# Patient Record
Sex: Male | Born: 1965 | Race: White | Hispanic: No | Marital: Married | Smoking: Never smoker
Health system: Southern US, Community
[De-identification: ages and names within clinical notes are randomized; demographics above are authoritative.]

## PROBLEM LIST (undated history)

## (undated) DIAGNOSIS — I10 Essential (primary) hypertension: Secondary | ICD-10-CM

## (undated) DIAGNOSIS — E7800 Pure hypercholesterolemia, unspecified: Secondary | ICD-10-CM

## (undated) HISTORY — PX: HERNIA REPAIR: SHX51

## (undated) HISTORY — PX: FRACTURE SURGERY: SHX138

## (undated) HISTORY — PX: CARDIAC CATHETERIZATION: SHX172

---

## 2001-12-14 ENCOUNTER — Ambulatory Visit (HOSPITAL_COMMUNITY): Admission: RE | Admit: 2001-12-14 | Discharge: 2001-12-14 | Payer: Self-pay | Admitting: Family Medicine

## 2001-12-14 ENCOUNTER — Encounter: Payer: Self-pay | Admitting: Family Medicine

## 2003-06-19 ENCOUNTER — Inpatient Hospital Stay (HOSPITAL_COMMUNITY): Admission: AD | Admit: 2003-06-19 | Discharge: 2003-06-20 | Payer: Self-pay | Admitting: Family Medicine

## 2006-12-02 ENCOUNTER — Emergency Department (HOSPITAL_COMMUNITY): Admission: EM | Admit: 2006-12-02 | Discharge: 2006-12-02 | Payer: Self-pay | Admitting: Emergency Medicine

## 2012-02-08 ENCOUNTER — Encounter (HOSPITAL_COMMUNITY): Payer: Self-pay | Admitting: *Deleted

## 2012-02-08 ENCOUNTER — Emergency Department (HOSPITAL_COMMUNITY): Payer: Worker's Compensation

## 2012-02-08 ENCOUNTER — Emergency Department (HOSPITAL_COMMUNITY)
Admission: EM | Admit: 2012-02-08 | Discharge: 2012-02-08 | Disposition: A | Discharge status: Home / Self Care | Payer: Worker's Compensation | Attending: Emergency Medicine | Admitting: Emergency Medicine

## 2012-02-08 DIAGNOSIS — S8390XA Sprain of unspecified site of unspecified knee, initial encounter: Secondary | ICD-10-CM

## 2012-02-08 DIAGNOSIS — M25569 Pain in unspecified knee: Secondary | ICD-10-CM | POA: Insufficient documentation

## 2012-02-08 DIAGNOSIS — I10 Essential (primary) hypertension: Secondary | ICD-10-CM | POA: Insufficient documentation

## 2012-02-08 DIAGNOSIS — E119 Type 2 diabetes mellitus without complications: Secondary | ICD-10-CM | POA: Insufficient documentation

## 2012-02-08 HISTORY — DX: Essential (primary) hypertension: I10

## 2012-02-08 MED ORDER — IBUPROFEN 800 MG PO TABS: 800.0000 mg | ORAL_TABLET | Freq: Three times a day (TID) | ORAL | Status: AC

## 2012-02-08 MED ORDER — HYDROCODONE-ACETAMINOPHEN 5-325 MG PO TABS: ORAL_TABLET | ORAL | Status: AC

## 2012-02-08 NOTE — ED Notes (Signed)
Twisted lt knee this am.

## 2012-02-08 NOTE — ED Provider Notes (Signed)
History     CSN: [id number]  Arrival date & time 02/08/12  1152   First MD Initiated Contact with Patient 02/08/12 1222      Chief Complaint  Patient presents with  . Knee Pain    (Consider location/radiation/quality/duration/timing/severity/associated sxs/prior treatment) Patient is a 46 y.o. male presenting with knee pain. The history is provided by the patient.  Knee Pain This is a new problem. The current episode started today. The problem occurs constantly. The problem has been unchanged. Associated symptoms include arthralgias. Pertinent negatives include no chills, fever, joint swelling, neck pain, numbness, vomiting or weakness. The symptoms are aggravated by twisting and walking. He has tried nothing for the symptoms. The treatment provided no relief.    Past Medical History  Diagnosis Date  . Diabetes mellitus   . Hypertension     Past Surgical History  Procedure Date  . Fracture surgery   . Hernia repair   . Cardiac catheterization     History reviewed. No pertinent family history.  History  Substance Use Topics  . Smoking status: Never Smoker   . Smokeless tobacco: Not on file  . Alcohol Use: Yes      Review of Systems  Constitutional: Negative for fever and chills.  HENT: Negative for neck pain.   Gastrointestinal: Negative for vomiting.  Genitourinary: Negative for dysuria and difficulty urinating.  Musculoskeletal: Positive for arthralgias. Negative for back pain and joint swelling.  Skin: Negative for color change and wound.  Neurological: Negative for weakness and numbness.  All other systems reviewed and are negative.    Allergies  Review of patient's allergies indicates no known allergies.  Home Medications   Current Outpatient Rx  Name Route Sig Dispense Refill  . METFORMIN HCL 500 MG PO TABS Oral Take 500 mg by mouth 2 (two) times daily with a meal.    . PRESCRIPTION MEDICATION Oral Take 1 tablet by mouth daily. Blood pressure       BP 141/88  Pulse 72  Temp 98.3 F (36.8 C) (Oral)  Resp 20  Ht 6\' 2"  (1.88 m)  Wt 245 lb (111.131 kg)  BMI 31.46 kg/m2  SpO2 100%  Physical Exam  Nursing note and vitals reviewed. Constitutional: He is oriented to person, place, and time. He appears well-developed and well-nourished. No distress.  Cardiovascular: Normal rate, regular rhythm, normal heart sounds and intact distal pulses.   Pulmonary/Chest: Effort normal and breath sounds normal.  Musculoskeletal: He exhibits tenderness. He exhibits no edema.       Left knee: He exhibits normal range of motion, no swelling, no effusion, no ecchymosis, no deformity, no laceration, no erythema and no bony tenderness. tenderness found. Medial joint line and lateral joint line tenderness noted. No patellar tendon tenderness noted.       Legs:      ttp of the left knee.  No erythema, bruising or deformity.    Neurological: He is alert and oriented to person, place, and time. He exhibits normal muscle tone. Coordination normal.  Skin: Skin is warm and dry. No erythema.    ED Course  Procedures (including critical care time)  Labs Reviewed - No data to display Dg Knee Complete 4 Views Left  02/08/2012  *RADIOLOGY REPORT*  Clinical Data: Twisting injury and pain.  LEFT KNEE - COMPLETE 4+ VIEW  Comparison: None  Findings: Four views of the left knee were obtained.  Negative for acute fracture or dislocation.  No significant degenerative changes.  No evidence  for a large joint effusion.  IMPRESSION: No acute bony abnormalities.  Original Report Authenticated By: Richarda Overlie, M.D.        MDM    ttp of the anterior left knee w/o deformity, effusion or obvious instability.  Patient is ambulatory.  Likely sprain.  He agrees to f/u with ortho if sx's are not improving  Patient / Family / Caregiver understand and agree with initial ED impression and plan with expectations set for ED visit. Pt stable in ED with no significant deterioration  in condition. Pt feels improved after observation and/or treatment in ED.    Prescribed: norco #15 ibuprofen   Medard Decuir L. Cristel Rail, [place] 02/10/12 1442

## 2012-02-08 NOTE — Discharge Instructions (Signed)
Knee Pain The knee is the complex joint between your thigh and your lower leg. It is made up of bones, tendons, ligaments, and cartilage. The bones that make up the knee are:  The femur in the thigh.   The tibia and fibula in the lower leg.   The patella or kneecap riding in the groove on the lower femur.  CAUSES  Knee pain is a common complaint with many causes. A few of these causes are:  Injury, such as:   A ruptured ligament or tendon injury.   Torn cartilage.   Medical conditions, such as:   Gout   Arthritis   Infections   Overuse, over training or overdoing a physical activity.  Knee pain can be minor or severe. Knee pain can accompany debilitating injury. Minor knee problems often respond well to self-care measures or get well on their own. More serious injuries may need medical intervention or even surgery. SYMPTOMS The knee is complex. Symptoms of knee problems can vary widely. Some of the problems are:  Pain with movement and weight bearing.   Swelling and tenderness.   Buckling of the knee.   Inability to straighten or extend your knee.   Your knee locks and you cannot straighten it.   Warmth and redness with pain and fever.   Deformity or dislocation of the kneecap.  DIAGNOSIS  Determining what is wrong may be very straight forward such as when there is an injury. It can also be challenging because of the complexity of the knee. Tests to make a diagnosis may include:  Your caregiver taking a history and doing a physical exam.   Routine X-rays can be used to rule out other problems. X-rays will not reveal a cartilage tear. Some injuries of the knee can be diagnosed by:   Arthroscopy a surgical technique by which a small video camera is inserted through tiny incisions on the sides of the knee. This procedure is used to examine and repair internal knee joint problems. Tiny instruments can be used during arthroscopy to repair the torn knee cartilage  (meniscus).   Arthrography is a radiology technique. A contrast liquid is directly injected into the knee joint. Internal structures of the knee joint then become visible on X-ray film.   An MRI scan is a non x-ray radiology procedure in which magnetic fields and a computer produce two- or three-dimensional images of the inside of the knee. Cartilage tears are often visible using an MRI scanner. MRI scans have largely replaced arthrography in diagnosing cartilage tears of the knee.   Blood work.   Examination of the fluid that helps to lubricate the knee joint (synovial fluid). This is done by taking a sample out using a needle and a syringe.  TREATMENT The treatment of knee problems depends on the cause. Some of these treatments are:  Depending on the injury, proper casting, splinting, surgery or physical therapy care will be needed.   Give yourself adequate recovery time. Do not overuse your joints. If you begin to get sore during workout routines, back off. Slow down or do fewer repetitions.   For repetitive activities such as cycling or running, maintain your strength and nutrition.   Alternate muscle groups. For example if you are a weight lifter, work the upper body on one day and the lower body the next.   Either tight or weak muscles do not give the proper support for your knee. Tight or weak muscles do not absorb the stress placed   on the knee joint. Keep the muscles surrounding the knee strong.   Take care of mechanical problems.   If you have flat feet, orthotics or special shoes may help. See your caregiver if you need help.   Arch supports, sometimes with wedges on the inner or outer aspect of the heel, can help. These can shift pressure away from the side of the knee most bothered by osteoarthritis.   A brace called an "unloader" brace also may be used to help ease the pressure on the most arthritic side of the knee.   If your caregiver has prescribed crutches, braces,  wraps or ice, use as directed. The acronym for this is PRICE. This means protection, rest, ice, compression and elevation.   Nonsteroidal anti-inflammatory drugs (NSAID's), can help relieve pain. But if taken immediately after an injury, they may actually increase swelling. Take NSAID's with food in your stomach. Stop them if you develop stomach problems. Do not take these if you have a history of ulcers, stomach pain or bleeding from the bowel. Do not take without your caregiver's approval if you have problems with fluid retention, heart failure, or kidney problems.   For ongoing knee problems, physical therapy may be helpful.   Glucosamine and chondroitin are over-the-counter dietary supplements. Both may help relieve the pain of osteoarthritis in the knee. These medicines are different from the usual anti-inflammatory drugs. Glucosamine may decrease the rate of cartilage destruction.   Injections of a corticosteroid drug into your knee joint may help reduce the symptoms of an arthritis flare-up. They may provide pain relief that lasts a few months. You may have to wait a few months between injections. The injections do have a small increased risk of infection, water retention and elevated blood sugar levels.   Hyaluronic acid injected into damaged joints may ease pain and provide lubrication. These injections may work by reducing inflammation. A series of shots may give relief for as long as 6 months.   Topical painkillers. Applying certain ointments to your skin may help relieve the pain and stiffness of osteoarthritis. Ask your pharmacist for suggestions. Many over the-counter products are approved for temporary relief of arthritis pain.   In some countries, doctors often prescribe topical NSAID's for relief of chronic conditions such as arthritis and tendinitis. A review of treatment with NSAID creams found that they worked as well as oral medications but without the serious side effects.    PREVENTION  Maintain a healthy weight. Extra pounds put more strain on your joints.   Get strong, stay limber. Weak muscles are a common cause of knee injuries. Stretching is important. Include flexibility exercises in your workouts.   Be smart about exercise. If you have osteoarthritis, chronic knee pain or recurring injuries, you may need to change the way you exercise. This does not mean you have to stop being active. If your knees ache after jogging or playing basketball, consider switching to swimming, water aerobics or other low-impact activities, at least for a few days a week. Sometimes limiting high-impact activities will provide relief.   Make sure your shoes fit well. Choose footwear that is right for your sport.   Protect your knees. Use the proper gear for knee-sensitive activities. Use kneepads when playing volleyball or laying carpet. Buckle your seat belt every time you drive. Most shattered kneecaps occur in car accidents.   Rest when you are tired.  SEEK MEDICAL CARE IF:  You have knee pain that is continual and does not   seem to be getting better.  SEEK IMMEDIATE MEDICAL CARE IF:  Your knee joint feels hot to the touch and you have a high fever. MAKE SURE YOU:   Understand these instructions.   Will watch your condition.   Will get help right away if you are not doing well or get worse.  Document Released: 05/29/2007 Document Revised: 07/21/2011 Document Reviewed: 05/29/2007 [employer] Patient Information 2012 [employer]. 

## 2012-02-11 NOTE — ED Provider Notes (Signed)
Medical screening examination/treatment/procedure(s) were performed by non-physician practitioner and as supervising physician I was immediately available for consultation/collaboration.   Joya Gaskins, MD 02/11/12 [phone]

## 2014-02-13 ENCOUNTER — Inpatient Hospital Stay (HOSPITAL_COMMUNITY)
Admission: EM | Admit: 2014-02-13 | Discharge: 2014-02-18 | DRG: 603 | Disposition: A | Discharge status: Home Health | Payer: BC Managed Care – PPO | Attending: Internal Medicine | Admitting: Internal Medicine

## 2014-02-13 ENCOUNTER — Emergency Department (HOSPITAL_COMMUNITY): Payer: BC Managed Care – PPO

## 2014-02-13 ENCOUNTER — Encounter (HOSPITAL_COMMUNITY): Payer: Self-pay | Admitting: Emergency Medicine

## 2014-02-13 DIAGNOSIS — L03113 Cellulitis of right upper limb: Secondary | ICD-10-CM | POA: Diagnosis present

## 2014-02-13 DIAGNOSIS — L03119 Cellulitis of unspecified part of limb: Secondary | ICD-10-CM | POA: Diagnosis present

## 2014-02-13 DIAGNOSIS — M25462 Effusion, left knee: Secondary | ICD-10-CM

## 2014-02-13 DIAGNOSIS — M25569 Pain in unspecified knee: Secondary | ICD-10-CM | POA: Diagnosis present

## 2014-02-13 DIAGNOSIS — M7021 Olecranon bursitis, right elbow: Secondary | ICD-10-CM

## 2014-02-13 DIAGNOSIS — L02818 Cutaneous abscess of other sites: Secondary | ICD-10-CM

## 2014-02-13 DIAGNOSIS — M10062 Idiopathic gout, left knee: Secondary | ICD-10-CM

## 2014-02-13 DIAGNOSIS — E871 Hypo-osmolality and hyponatremia: Secondary | ICD-10-CM

## 2014-02-13 DIAGNOSIS — M545 Low back pain, unspecified: Secondary | ICD-10-CM | POA: Diagnosis present

## 2014-02-13 DIAGNOSIS — Z881 Allergy status to other antibiotic agents status: Secondary | ICD-10-CM

## 2014-02-13 DIAGNOSIS — IMO0002 Reserved for concepts with insufficient information to code with codable children: Principal | ICD-10-CM | POA: Diagnosis present

## 2014-02-13 DIAGNOSIS — L03818 Cellulitis of other sites: Secondary | ICD-10-CM

## 2014-02-13 DIAGNOSIS — M109 Gout, unspecified: Secondary | ICD-10-CM | POA: Diagnosis present

## 2014-02-13 DIAGNOSIS — I10 Essential (primary) hypertension: Secondary | ICD-10-CM | POA: Diagnosis present

## 2014-02-13 DIAGNOSIS — M25469 Effusion, unspecified knee: Secondary | ICD-10-CM | POA: Diagnosis present

## 2014-02-13 DIAGNOSIS — E119 Type 2 diabetes mellitus without complications: Secondary | ICD-10-CM | POA: Diagnosis present

## 2014-02-13 LAB — BASIC METABOLIC PANEL
Anion gap: 14 (ref 5–15)
BUN: 10 mg/dL (ref 6–23)
CO2: 25 mEq/L (ref 19–32)
Calcium: 9.1 mg/dL (ref 8.4–10.5)
Chloride: 99 mEq/L (ref 96–112)
Creatinine, Ser: 0.83 mg/dL (ref 0.50–1.35)
GFR calc Af Amer: >90 mL/min (ref >90)
GFR calc non Af Amer: >90 mL/min (ref >90)
Glucose, Bld: 170 mg/dL — ABNORMAL HIGH (ref 70–99)
Potassium: 3.8 mEq/L (ref 3.7–5.3)
Sodium: 138 mEq/L (ref 137–147)

## 2014-02-13 LAB — CBC WITH DIFFERENTIAL/PLATELET
Basophils Absolute: 0 10*3/uL (ref 0.0–0.1)
Basophils Relative: 0 % (ref 0–1)
Eosinophils Absolute: 0.2 10*3/uL (ref 0.0–0.7)
Eosinophils Relative: 2 % (ref 0–5)
HCT: 37.5 % — ABNORMAL LOW (ref 39.0–52.0)
Hemoglobin: 12.8 g/dL — ABNORMAL LOW (ref 13.0–17.0)
Lymphocytes Relative: 17 % (ref 12–46)
Lymphs Abs: 1.5 10*3/uL (ref 0.7–4.0)
MCH: 32.8 pg (ref 26.0–34.0)
MCHC: 34.1 g/dL (ref 30.0–36.0)
MCV: 96.2 fL (ref 78.0–100.0)
Monocytes Absolute: 0.9 10*3/uL (ref 0.1–1.0)
Monocytes Relative: 10 % (ref 3–12)
Neutro Abs: 6.5 10*3/uL (ref 1.7–7.7)
Neutrophils Relative %: 71 % (ref 43–77)
Platelets: 152 10*3/uL (ref 150–400)
RBC: 3.9 MIL/uL — ABNORMAL LOW (ref 4.22–5.81)
RDW: 12.3 % (ref 11.5–15.5)
WBC: 9.1 10*3/uL (ref 4.0–10.5)

## 2014-02-13 LAB — HEMOGLOBIN A1C
HEMOGLOBIN A1C: 6.8 % — AB (ref <5.7)
Mean Plasma Glucose: 148 mg/dL — ABNORMAL HIGH (ref <117)

## 2014-02-13 LAB — CBG MONITORING, ED: Glucose-Capillary: 159 mg/dL — ABNORMAL HIGH (ref 70–99)

## 2014-02-13 MED ORDER — PIPERACILLIN-TAZOBACTAM 3.375 G IVPB
3.3750 g | Freq: Three times a day (TID) | INTRAVENOUS | Status: DC
  Administered 2014-02-13 – 2014-02-14 (×3): 3.375 g via INTRAVENOUS
  Filled 2014-02-13 (×12): qty 50

## 2014-02-13 MED ORDER — METFORMIN HCL 500 MG PO TABS
500.0000 mg | ORAL_TABLET | Freq: Two times a day (BID) | ORAL | Status: DC
  Administered 2014-02-14: 500 mg via ORAL
  Filled 2014-02-13: qty 1

## 2014-02-13 MED ORDER — ACETAMINOPHEN 650 MG RE SUPP: 650.0000 mg | Freq: Four times a day (QID) | RECTAL | Status: DC | PRN

## 2014-02-13 MED ORDER — ONDANSETRON HCL 4 MG/2ML IJ SOLN: 4.0000 mg | Freq: Four times a day (QID) | INTRAMUSCULAR | Status: DC | PRN

## 2014-02-13 MED ORDER — ALUM & MAG HYDROXIDE-SIMETH 200-200-20 MG/5ML PO SUSP: 30.0000 mL | Freq: Four times a day (QID) | ORAL | Status: DC | PRN

## 2014-02-13 MED ORDER — PIPERACILLIN-TAZOBACTAM 3.375 G IVPB
INTRAVENOUS | Status: AC
  Filled 2014-02-13: qty 100

## 2014-02-13 MED ORDER — HEPARIN SODIUM (PORCINE) 5000 UNIT/ML IJ SOLN
5000.0000 [IU] | Freq: Three times a day (TID) | INTRAMUSCULAR | Status: DC
  Administered 2014-02-13 – 2014-02-18 (×14): 5000 [IU] via SUBCUTANEOUS
  Filled 2014-02-13 (×13): qty 1

## 2014-02-13 MED ORDER — SODIUM CHLORIDE 0.9 % IV SOLN
INTRAVENOUS | Status: DC
  Administered 2014-02-13 – 2014-02-16 (×4): via INTRAVENOUS

## 2014-02-13 MED ORDER — ZOLPIDEM TARTRATE 5 MG PO TABS
5.0000 mg | ORAL_TABLET | Freq: Every evening | ORAL | Status: DC | PRN
  Administered 2014-02-13 – 2014-02-14 (×3): 5 mg via ORAL
  Filled 2014-02-13 (×4): qty 1

## 2014-02-13 MED ORDER — ACETAMINOPHEN 325 MG PO TABS: 650.0000 mg | ORAL_TABLET | Freq: Four times a day (QID) | ORAL | Status: DC | PRN

## 2014-02-13 MED ORDER — ONDANSETRON HCL 4 MG PO TABS: 4.0000 mg | ORAL_TABLET | Freq: Four times a day (QID) | ORAL | Status: DC | PRN

## 2014-02-13 MED ORDER — OXYCODONE HCL 5 MG PO TABS
5.0000 mg | ORAL_TABLET | ORAL | Status: DC | PRN
  Administered 2014-02-13 – 2014-02-17 (×14): 5 mg via ORAL
  Filled 2014-02-13 (×14): qty 1

## 2014-02-13 MED ORDER — VANCOMYCIN HCL IN DEXTROSE 1-5 GM/200ML-% IV SOLN
1000.0000 mg | Freq: Once | INTRAVENOUS | Status: DC
Start: 2014-02-13 — End: 2014-02-13
  Administered 2014-02-13: 1000 mg via INTRAVENOUS
  Filled 2014-02-13: qty 200

## 2014-02-13 MED ORDER — DIPHENHYDRAMINE HCL 50 MG/ML IJ SOLN
25.0000 mg | Freq: Once | INTRAMUSCULAR | Status: AC
  Administered 2014-02-13: 25 mg via INTRAVENOUS
  Filled 2014-02-13: qty 1

## 2014-02-13 MED ORDER — DOXYCYCLINE HYCLATE 100 MG IV SOLR
100.0000 mg | Freq: Once | INTRAVENOUS | Status: AC
  Administered 2014-02-13: 100 mg via INTRAVENOUS
  Filled 2014-02-13: qty 100

## 2014-02-13 MED ORDER — SODIUM CHLORIDE 0.9 % IV SOLN
INTRAVENOUS | Status: DC
  Administered 2014-02-13: 1000 mL via INTRAVENOUS

## 2014-02-13 MED ORDER — LOSARTAN POTASSIUM 50 MG PO TABS
50.0000 mg | ORAL_TABLET | Freq: Every day | ORAL | Status: DC
  Administered 2014-02-14 – 2014-02-18 (×5): 50 mg via ORAL
  Filled 2014-02-13 (×5): qty 1

## 2014-02-13 MED ORDER — FAMOTIDINE IN NACL 20-0.9 MG/50ML-% IV SOLN
20.0000 mg | Freq: Once | INTRAVENOUS | Status: AC
  Administered 2014-02-13: 20 mg via INTRAVENOUS
  Filled 2014-02-13: qty 50

## 2014-02-13 NOTE — ED Provider Notes (Signed)
CSN: [id number]     Arrival date & time 02/13/14  1152 History  This chart was scribed for Allen Mulders, MD by Leone Payor, ED Scribe. This patient was seen in room APA15/[facility]15 and the patient's care was started 1:26 PM.    Chief Complaint  Patient presents with  . Elbow Pain   The history is provided by the patient. No language interpreter was used.    HPI Comments: Allen Kaiser is a 48 y.o. male who presents to the Emergency Department complaining of 3 days of gradual onset, constant, gradually worsening right elbow swelling and redness. He reports associated pain that is gradually improving. Patient states initially the pain was 9/10 but currently is minimal. He states the pain is worse with movement but is 0/10 with rest. He has taken ibuprofen 800 mg with moderate relief. He denies known injury or trauma to the affected area. He denies history of similar symptoms elsewhere on the body. Patient states he has been seen at [facility] 3 times this week and was started on Bactrim which he has been compliant with. He has a history of gout to the foot about 10 years ago. He denies fever, nausea, vomiting.   Past Medical History  Diagnosis Date  . Diabetes mellitus   . Hypertension    Past Surgical History  Procedure Laterality Date  . Hernia repair    . Fracture surgery    . Cardiac catheterization     History reviewed. No pertinent family history. History  Substance Use Topics  . Smoking status: Never Smoker   . Smokeless tobacco: Not on file  . Alcohol Use: Yes    Review of Systems  Constitutional: Negative for fever and chills.  HENT: Negative for rhinorrhea and sore throat.   Eyes: Negative for visual disturbance.  Respiratory: Positive for cough. Negative for shortness of breath.   Cardiovascular: Negative for chest pain and leg swelling.  Gastrointestinal: Negative for nausea, vomiting and abdominal pain.  Genitourinary: Negative for dysuria.  Musculoskeletal:  Positive for arthralgias and joint swelling. Negative for back pain and neck pain.  Skin: Positive for color change (right elbow). Negative for rash.  Neurological: Negative for headaches.  Hematological: Does not bruise/bleed easily.  Psychiatric/Behavioral: Negative for confusion.      Allergies  Review of patient's allergies indicates no known allergies.  Home Medications   Prior to Admission medications   Medication Sig Start Date End Date Taking? Authorizing Provider  losartan (COZAAR) 50 MG tablet Take 50 mg by mouth daily.   Yes Historical Provider, MD  metFORMIN (GLUCOPHAGE) 500 MG tablet Take 500 mg by mouth 2 (two) times daily with a meal.   Yes Historical Provider, MD  sulfamethoxazole-trimethoprim (BACTRIM DS) 800-160 MG per tablet Take 1 tablet by mouth 2 (two) times daily. Started on 02/12/14   Yes Historical Provider, MD   BP 115/82  Pulse 66  Temp(Src) 98.3 F (36.8 C) (Oral)  Resp 16  Ht 6\' 2"  (1.88 m)  Wt 249 lb (112.946 kg)  BMI 31.96 kg/m2  SpO2 100% Physical Exam  Nursing note and vitals reviewed. Constitutional: He is oriented to person, place, and time. He appears well-developed and well-nourished.  HENT:  Head: Normocephalic and atraumatic.  Eyes: Conjunctivae and EOM are normal.  Cardiovascular: Normal rate, regular rhythm and normal heart sounds.   Pulses:      Radial pulses are 2+ on the right side.  Pulmonary/Chest: Effort normal and breath sounds normal. No respiratory distress.  He has no wheezes. He has no rales.  Abdominal: Soft. Bowel sounds are normal. He exhibits no distension. There is no tenderness.  Musculoskeletal: Normal range of motion. He exhibits no edema.  Swelling over olecranon bursa, tight and warm. Redness up the arm and down forearm measuring 12 cm. Not particularly tender to palpation.   Neurological: He is alert and oriented to person, place, and time. No cranial nerve deficit. Coordination normal.  Skin: Skin is warm and dry.   Psychiatric: He has a normal mood and affect.    ED Course  Procedures (including critical care time)  DIAGNOSTIC STUDIES: Oxygen Saturation is 98% on RA, normal by my interpretation.    COORDINATION OF CARE: 1:30 PM Discussed treatment plan with pt at bedside and pt agreed to plan.  2:39 PM Patient developed hives at the IV site upon receiving vancomycin.    Labs Review Labs Reviewed  BASIC METABOLIC PANEL - Abnormal; Notable for the following:    Glucose, Bld 170 (*)    All other components within normal limits  CBC WITH DIFFERENTIAL - Abnormal; Notable for the following:    RBC 3.90 (*)    Hemoglobin 12.8 (*)    HCT 37.5 (*)    All other components within normal limits  CBG MONITORING, ED - Abnormal; Notable for the following:    Glucose-Capillary 159 (*)    All other components within normal limits   Results for orders placed during the hospital encounter of 02/13/14  BASIC METABOLIC PANEL      Result Value Ref Range   Sodium 138  137 - 147 mEq/L   Potassium 3.8  3.7 - 5.3 mEq/L   Chloride 99  96 - 112 mEq/L   CO2 25  19 - 32 mEq/L   Glucose, Bld 170 (*) 70 - 99 mg/dL   BUN 10  6 - 23 mg/dL   Creatinine, Ser 1.610.83  0.50 - 1.35 mg/dL   Calcium 9.1  8.4 - 09.610.5 mg/dL   GFR calc non Af Amer >90  >90 mL/min   GFR calc Af Amer >90  >90 mL/min   Anion gap 14  5 - 15  CBC WITH DIFFERENTIAL      Result Value Ref Range   WBC 9.1  4.0 - 10.5 K/uL   RBC 3.90 (*) 4.22 - 5.81 MIL/uL   Hemoglobin 12.8 (*) 13.0 - 17.0 g/dL   HCT 04.537.5 (*) 40.939.0 - 81.152.0 %   MCV 96.2  78.0 - 100.0 fL   MCH 32.8  26.0 - 34.0 pg   MCHC 34.1  30.0 - 36.0 g/dL   RDW 91.412.3  78.211.5 - 95.615.5 %   Platelets 152  150 - 400 K/uL   Neutrophils Relative % 71  43 - 77 %   Neutro Abs 6.5  1.7 - 7.7 K/uL   Lymphocytes Relative 17  12 - 46 %   Lymphs Abs 1.5  0.7 - 4.0 K/uL   Monocytes Relative 10  3 - 12 %   Monocytes Absolute 0.9  0.1 - 1.0 K/uL   Eosinophils Relative 2  0 - 5 %   Eosinophils Absolute 0.2   0.0 - 0.7 K/uL   Basophils Relative 0  0 - 1 %   Basophils Absolute 0.0  0.0 - 0.1 K/uL  CBG MONITORING, ED      Result Value Ref Range   Glucose-Capillary 159 (*) 70 - 99 mg/dL    Imaging Review Dg Elbow Complete Right  02/13/2014   CLINICAL DATA:  Elbow pain, erythema and swelling.  No acute injury.  EXAM: RIGHT ELBOW - COMPLETE 3+ VIEW  COMPARISON:  None.  FINDINGS: The mineralization and alignment are normal. There is no evidence of acute fracture or dislocation. The joint spaces are maintained. There is spurring of the olecranon and coronoid processes. There is no elbow joint effusion. Subcutaneous edema is noted surrounding the elbow, especially posteriorly. There appears to be some swelling over the olecranon process, suggesting possible olecranon bursitis.  IMPRESSION: 1. No acute osseous findings. 2. Soft tissue swelling around the elbow, especially posteriorly. Consider olecranon bursitis.   Electronically Signed   By: Roxy HorsemanBill  Veazey M.D.   On: 02/13/2014 16:25     EKG Interpretation None      MDM   Final diagnoses:  Cellulitis of other specified site  Olecranon bursitis, right    Patient would most likely olecranon bursitis has developed a cellulitis. No evidence of joint infection. She has good range of motion of the elbow. The started with vancomycin here patient had an allergic reaction at the site where the vancomycin was going in IV develop hives in that arm. Vancomycin stopped patient treated Pepcid and Benadryl the hives are resolved. Patient switched over to doxycycline. Patient will need admission and continuation on IV doxycycline. The cellulitis is progressively getting worse. Patient's been seen by his primary care doctor everyday this week started on Septra 2 days ago. No significant improvement actually things have gotten worse. Sent in here from their office. Patient has a history of diabetes her sugars are well controlled. Patient's not febrile no leukocytosis. Do not  feel that there is a significant joint infection or abscess. The elbow and is nontender to palpation.  I personally performed the services described in this documentation, which was scribed in my presence. The recorded information has been reviewed and is accurate.     Allen MuldersScott Luv Mish, MD 02/13/14 [phone]

## 2014-02-13 NOTE — H&P (Addendum)
[facility] History and Physical  Allen Kaiser Limbaugh WUJ:[id number] DOB: 1966-03-25 DOA: 02/13/2014  Referring physician: Vanetta MuldersScott Zackowski, MD PCP: No primary provider on file.   Chief Complaint: Cellulitis  HPI: Allen Kaiser Mundorf is a 48 y.o. male diabetic who states that he has had redness and swelling of his right arm since about Monday. He states that he was seen in the PCP office on wednsday and was given antibiotics. He notes that the redness was getting worse. [name] Patient states that he is a diabetic and takes metformin. He does not keep track of his blood glucose levels. In addition he states that he has not had any trauma to the elbow or arm and has not had any recent travel   Review of Systems:  Constitutional:  No weight loss, night sweats, Fevers, chills HEENT:  No headaches  Cardio-vascular:  No chest pain, Orthopnea, PND, swelling in lower extremities, anasarca, dizziness, palpitations  GI:  No heartburn, indigestion, abdominal pain, nausea, vomiting, diarrhea, change in bowel habits, loss of appetite  Resp:  No shortness of breath with exertion or at rest. No excess mucus, no productive cough, No non-productive cough, No coughing up of blood.No change in color of mucus.No wheezing.No chest wall deformity  Skin:  ++rash lesion swelling noted.  GU:  no dysuria, change in color of urine, no urgency or frequency. No flank pain.  Musculoskeletal:  ++joint pain ++swelling. No back pain.  Psych:  No change in mood or affect. No depression or anxiety. No memory loss.   Past Medical History  Diagnosis Date  . Diabetes mellitus   . Hypertension    Past Surgical History  Procedure Laterality Date  . Hernia repair    . Fracture surgery    . Cardiac catheterization     Social History:  reports that he has never  smoked. He does not have any smokeless tobacco history on file. He reports that he drinks alcohol. He reports that he does not use illicit drugs.  No Known Allergies  History reviewed. No pertinent family history.   Prior to Admission medications   Medication Sig Start Date End Date Taking? Authorizing Provider  losartan (COZAAR) 50 MG tablet Take 50 mg by mouth daily.   Yes Historical Provider, MD  metFORMIN (GLUCOPHAGE) 500 MG tablet Take 500 mg by mouth 2 (two) times daily with a meal.   Yes Historical Provider, MD  sulfamethoxazole-trimethoprim (BACTRIM DS) 800-160 MG per tablet Take 1 tablet by mouth 2 (two) times daily. Started on 02/12/14   Yes Historical Provider, MD   Physical Exam: Filed Vitals:   02/13/14 1542  BP: 115/82  Pulse: 66  Temp:   Resp: 16    BP 115/82  Pulse 66  Temp(Src) 98.3 F (36.8 C) (Oral)  Resp 16  Ht 6\' 2"  (1.88 m)  Wt 112.946 kg (249 lb)  BMI 31.96 kg/m2  SpO2 100%  General:  Appears calm and comfortable Eyes: PERRL, normal lids, irises & conjunctiva ENT: grossly normal hearing, lips & tongue Neck: no LAD, masses or thyromegaly Cardiovascular: RRR, no m/r/g. No LE edema. Respiratory: CTA bilaterally, no w/r/r. Normal respiratory effort. Abdomen: soft, ntnd Skin: olecrenon bursitis and also there is significant induration and erythema on the medial aspect of the right elbow arm and forearm Musculoskeletal: right elbow swollen with erythema  noted and induration Psychiatric: grossly normal mood and affect, speech fluent and appropriate Neurologic: grossly non-focal.          Labs on Admission:  Basic Metabolic Panel:  Recent Labs Lab 02/13/14 1225  NA 138  K 3.8  CL 99  CO2 25  GLUCOSE 170*  BUN 10  CREATININE 0.83  CALCIUM 9.1   Liver Function Tests: No results found for this basename: AST, ALT, ALKPHOS, BILITOT, PROT, ALBUMIN,  in the last 168 hours No results found for this basename: LIPASE, AMYLASE,  in the last 168  hours No results found for this basename: AMMONIA,  in the last 168 hours CBC:  Recent Labs Lab 02/13/14 1225  WBC 9.1  NEUTROABS 6.5  HGB 12.8*  HCT 37.5*  MCV 96.2  PLT 152   Cardiac Enzymes: No results found for this basename: CKTOTAL, CKMB, CKMBINDEX, TROPONINI,  in the last 168 hours  BNP (last 3 results) No results found for this basename: PROBNP,  in the last 8760 hours CBG:  Recent Labs Lab 02/13/14 1402  GLUCAP 159*    Radiological Exams on Admission: Dg Elbow Complete Right  02/13/2014   CLINICAL DATA:  Elbow pain, erythema and swelling.  No acute injury.  EXAM: RIGHT ELBOW - COMPLETE 3+ VIEW  COMPARISON:  None.  FINDINGS: The mineralization and alignment are normal. There is no evidence of acute fracture or dislocation. The joint spaces are maintained. There is spurring of the olecranon and coronoid processes. There is no elbow joint effusion. Subcutaneous edema is noted surrounding the elbow, especially posteriorly. There appears to be some swelling over the olecranon process, suggesting possible olecranon bursitis.  IMPRESSION: 1. No acute osseous findings. 2. Soft tissue swelling around the elbow, especially posteriorly. Consider olecranon bursitis.   Electronically Signed   By: Roxy HorsemanBill  Veazey M.D.   On: 02/13/2014 16:25     Assessment/Plan Active Problems:   Diabetes mellitus   Cellulitis of right arm   Hypertension   Cellulitis of arm   1. Cellulitis of right arm -will start on IV antibiotics as he has been getting worse -given vanc in the ED but had a rash -will start on zosyn for now -cultures drawn  2. Diabetes Mellitus -he states he does not normally check his sugars at home -will check an A1C -monitor CBG -continue metformin  3. Hypertension -will continue with home medications    Code Status: Full Code indicate code status--if unknown or must be presumed, indicate so) Family Communication: None (indicate person spoken with, if applicable,  with phone number if by telephone) Disposition Plan: Home (indicate anticipated LOS)  Time spent: 60min  [facility] LLCKHAN,SAADAT A [facility] Pager [phone]  **Disclaimer: This note may have been dictated with voice recognition software. Similar sounding words can inadvertently be transcribed and this note may contain transcription errors which may not have been corrected upon publication of note.**

## 2014-02-13 NOTE — ED Notes (Signed)
Pain,swelling , redness rt elbow. [facility]  Has been seen there earlier in week, Sent here for eval

## 2014-02-13 NOTE — Progress Notes (Signed)
ANTIBIOTIC CONSULT NOTE - INITIAL  Pharmacy Consult for Zosyn Indication: Celluliti  No Known Allergies  Patient Measurements: Height: 6\' 2"  (188 cm) Weight: 249 lb (112.946 kg) IBW/kg (Calculated) : 82.2 Adjusted Body Weight:   Vital Signs: Temp: 98.4 F (36.9 C) (07/02 1813) Temp src: Oral (07/02 1813) BP: 120/80 mmHg (07/02 1813) Pulse Rate: 71 (07/02 1813) Intake/Output from previous day:   Intake/Output from this shift:    Labs:  Recent Labs  02/13/14 1225  WBC 9.1  HGB 12.8*  PLT 152  CREATININE 0.83   Estimated Creatinine Clearance: 145.5 ml/min (by C-G formula based on Cr of 0.83). No results found for this basename: VANCOTROUGH, VANCOPEAK, VANCORANDOM, GENTTROUGH, GENTPEAK, GENTRANDOM, TOBRATROUGH, TOBRAPEAK, TOBRARND, AMIKACINPEAK, AMIKACINTROU, AMIKACIN,  in the last 72 hours   Microbiology: No results found for this or any previous visit (from the past 720 hour(s)).  Medical History: Past Medical History  Diagnosis Date  . Diabetes mellitus   . Hypertension     Medications:  Scheduled:  . heparin  5,000 Units Subcutaneous 3 times per day  . losartan  50 mg Oral Daily  . [START ON 02/14/2014] metFORMIN  500 mg Oral BID WC  . piperacillin-tazobactam (ZOSYN)  IV  3.375 g Intravenous Q8H   Assessment: Cellulitis right arm Vancomycin given in ED, stopped due to reaction   Goal of Therapy:  Eradicate infection  Plan:  Zosyn 3.375 GM IV every 8 hours, infuse each dose over 4 hours Monitor renal function Labs per protocol  Raquel JamesPittman, Sanaya Gwilliam Bennett 02/13/2014,6:42 PM

## 2014-02-14 LAB — CBC
HCT: 36.8 % — ABNORMAL LOW (ref 39.0–52.0)
Hemoglobin: 12.5 g/dL — ABNORMAL LOW (ref 13.0–17.0)
MCH: 33.2 pg (ref 26.0–34.0)
MCHC: 34 g/dL (ref 30.0–36.0)
MCV: 97.9 fL (ref 78.0–100.0)
PLATELETS: 153 10*3/uL (ref 150–400)
RBC: 3.76 MIL/uL — ABNORMAL LOW (ref 4.22–5.81)
RDW: 12.5 % (ref 11.5–15.5)
WBC: 7.8 10*3/uL (ref 4.0–10.5)

## 2014-02-14 LAB — COMPREHENSIVE METABOLIC PANEL
ALT: 27 U/L (ref 0–53)
AST: 26 U/L (ref 0–37)
Albumin: 2.9 g/dL — ABNORMAL LOW (ref 3.5–5.2)
Alkaline Phosphatase: 88 U/L (ref 39–117)
Anion gap: 14 (ref 5–15)
BUN: 11 mg/dL (ref 6–23)
CALCIUM: 8.9 mg/dL (ref 8.4–10.5)
CO2: 23 mEq/L (ref 19–32)
Chloride: 100 mEq/L (ref 96–112)
Creatinine, Ser: 0.89 mg/dL (ref 0.50–1.35)
GFR calc non Af Amer: >90 mL/min (ref >90)
GLUCOSE: 161 mg/dL — AB (ref 70–99)
Potassium: 4.2 mEq/L (ref 3.7–5.3)
SODIUM: 137 meq/L (ref 137–147)
Total Bilirubin: 0.5 mg/dL (ref 0.3–1.2)
Total Protein: 6.6 g/dL (ref 6.0–8.3)

## 2014-02-14 LAB — GLUCOSE, CAPILLARY
GLUCOSE-CAPILLARY: 179 mg/dL — AB (ref 70–99)
Glucose-Capillary: 136 mg/dL — ABNORMAL HIGH (ref 70–99)
Glucose-Capillary: 159 mg/dL — ABNORMAL HIGH (ref 70–99)

## 2014-02-14 LAB — TSH: TSH: 2.25 u[IU]/mL (ref 0.350–4.500)

## 2014-02-14 MED ORDER — INSULIN ASPART 100 UNIT/ML ~~LOC~~ SOLN
0.0000 [IU] | Freq: Every day | SUBCUTANEOUS | Status: DC
  Administered 2014-02-15: 2 [IU] via SUBCUTANEOUS
  Administered 2014-02-17: 5 [IU] via SUBCUTANEOUS

## 2014-02-14 MED ORDER — MORPHINE SULFATE 2 MG/ML IJ SOLN
2.0000 mg | INTRAMUSCULAR | Status: DC | PRN
  Administered 2014-02-14 (×2): 2 mg via INTRAVENOUS
  Filled 2014-02-14 (×2): qty 1

## 2014-02-14 MED ORDER — INSULIN ASPART 100 UNIT/ML ~~LOC~~ SOLN
0.0000 [IU] | Freq: Three times a day (TID) | SUBCUTANEOUS | Status: DC
  Administered 2014-02-14 – 2014-02-16 (×5): 3 [IU] via SUBCUTANEOUS
  Administered 2014-02-16: 8 [IU] via SUBCUTANEOUS
  Administered 2014-02-16: 3 [IU] via SUBCUTANEOUS
  Administered 2014-02-17 (×2): 8 [IU] via SUBCUTANEOUS
  Administered 2014-02-17: 5 [IU] via SUBCUTANEOUS
  Administered 2014-02-18: 3 [IU] via SUBCUTANEOUS

## 2014-02-14 MED ORDER — CLINDAMYCIN PHOSPHATE 600 MG/50ML IV SOLN
600.0000 mg | Freq: Three times a day (TID) | INTRAVENOUS | Status: DC
  Administered 2014-02-14 – 2014-02-16 (×6): 600 mg via INTRAVENOUS
  Filled 2014-02-14 (×13): qty 50

## 2014-02-14 MED ORDER — MORPHINE SULFATE 2 MG/ML IJ SOLN
2.0000 mg | INTRAMUSCULAR | Status: DC | PRN
  Administered 2014-02-14 – 2014-02-16 (×9): 2 mg via INTRAVENOUS
  Filled 2014-02-14 (×9): qty 1

## 2014-02-14 NOTE — Progress Notes (Signed)
[facility] PROGRESS NOTE  Allen Kaiser ZOX:[id number] DOB: 09-Jan-1966 DOA: 02/13/2014 PCP: No primary provider on file.  Assessment/Plan: 1. Right elbow cellulitis. Concern for underlying joint infection. Patient was placed on Zosyn. This has been switched to clindamycin for MRSA coverage. We'll continue with current treatments. Keep right arm elevated. Will class orthopedic evaluation to see if joint needs aspiration. He does not have any leukocytosis and has not had any fevers since being in the hospital. He does have a history of gout, but reports last attack was approximately 10 years ago. 2. Diabetes. Continue sliding scale insulin. Metformin on hold. 3. Hypertension. Continue outpatient regimen  Code Status: full code Family Communication: discussed with patient and wife over the phone Disposition Plan: discharge home once improved   Consultants:  Orthopedic pending  Procedures:    Antibiotics:  vancomyin 7/2, discontinued due to allergic reaction  Zosyn 7/2>>7/3  Clindamycin 7/3>>  HPI/Subjective: Continued pain in right elbow, decreased range of motion  Objective: Filed Vitals:   02/14/14 1412  BP: 122/78  Pulse: 79  Temp: 98.7 F (37.1 C)  Resp: 20    Intake/Output Summary (Last 24 hours) at 02/14/14 1951 Last data filed at 02/14/14 1800  Gross per 24 hour  Intake   2204 ml  Output    525 ml  Net   1679 ml   Filed Weights   02/13/14 1159  Weight: 112.946 kg (249 lb)    Exam:   General:  NAD  Cardiovascular: S1, S2 RRR  Respiratory: CTA B  Abdomen: soft, nt, nd, bs+  Musculoskeletal: right elbow is edematous with effusion, tender to palpation, decreased range of motion   Data Reviewed: Basic Metabolic Panel:  Recent Labs Lab 02/13/14 1225 02/14/14 0546  NA 138 137  K 3.8 4.2  CL 99 100  CO2 25 23  GLUCOSE 170* 161*  BUN 10 11  CREATININE 0.83 0.89  CALCIUM 9.1 8.9   Liver Function Tests:  Recent Labs Lab  02/14/14 0546  AST 26  ALT 27  ALKPHOS 88  BILITOT 0.5  PROT 6.6  ALBUMIN 2.9*   No results found for this basename: LIPASE, AMYLASE,  in the last 168 hours No results found for this basename: AMMONIA,  in the last 168 hours CBC:  Recent Labs Lab 02/13/14 1225 02/14/14 0546  WBC 9.1 7.8  NEUTROABS 6.5  --   HGB 12.8* 12.5*  HCT 37.5* 36.8*  MCV 96.2 97.9  PLT 152 153   Cardiac Enzymes: No results found for this basename: CKTOTAL, CKMB, CKMBINDEX, TROPONINI,  in the last 168 hours BNP (last 3 results) No results found for this basename: PROBNP,  in the last 8760 hours CBG:  Recent Labs Lab 02/13/14 1402 02/14/14 0733 02/14/14 1623  GLUCAP 159* 136* 179*    No results found for this or any previous visit (from the past 240 hour(s)).   Studies: Dg Elbow Complete Right  02/13/2014   CLINICAL DATA:  Elbow pain, erythema and swelling.  No acute injury.  EXAM: RIGHT ELBOW - COMPLETE 3+ VIEW  COMPARISON:  None.  FINDINGS: The mineralization and alignment are normal. There is no evidence of acute fracture or dislocation. The joint spaces are maintained. There is spurring of the olecranon and coronoid processes. There is no elbow joint effusion. Subcutaneous edema is noted surrounding the elbow, especially posteriorly. There appears to be some swelling over the olecranon process, suggesting possible olecranon bursitis.  IMPRESSION: 1. No acute osseous findings. 2. Soft  tissue swelling around the elbow, especially posteriorly. Consider olecranon bursitis.   Electronically Signed   By: Roxy HorsemanBill  Veazey M.D.   On: 02/13/2014 16:25    Scheduled Meds: . clindamycin (CLEOCIN) IV  600 mg Intravenous 3 times per day  . heparin  5,000 Units Subcutaneous 3 times per day  . insulin aspart  0-15 Units Subcutaneous TID WC  . insulin aspart  0-5 Units Subcutaneous QHS  . losartan  50 mg Oral Daily   Continuous Infusions: . sodium chloride 50 mL/hr at 02/13/14 1824    Active Problems:    Diabetes mellitus   Cellulitis of right arm   Hypertension   Cellulitis of arm    Time spent: 35mins    Reginia Battie  [facility] Pager [phone]. If 7PM-7AM, please contact night-coverage at [email or web address], password [email or web address] 02/14/2014, 7:51 PM  LOS: 1 day

## 2014-02-14 NOTE — Progress Notes (Signed)
02/14/14 1206 Patient c/o persistent pain to right elbow, also stated "now my knee is starting to act up too". States pain unchanged with oxycodone 5 mg po as ordered PRN for pain this morning. Notified Dr. Kerry HoughMemon. Stated will see patient and address. Notified patient, stated okay. Earnstine RegalAshley Jacquie Lukes, RN

## 2014-02-14 NOTE — Progress Notes (Signed)
02/14/14 1427 Morphine 2 mg IV given as ordered PRN pain this afternoon. On reassessment, patient states "i don't feel anything right now unless I bump my arm on something". States comfortable, pain decreased with morphine. Stated "sometimes pain medication just doesn't work for me". Nursing to monitor. Earnstine RegalAshley Johana Hopkinson, RN

## 2014-02-14 NOTE — Progress Notes (Signed)
02/14/14 1807 Patient and wife expressed concerns regarding right elbow, wife stated "it looks worse now than it did last night". Discussed current IV antibiotics and keeping right arm elevated on pillow to help reduce swelling as MD discussed this afternoon. Pt stated understood. Pt aware of orthopedic consult in place. Dr Romeo AppleHarrison notified of consult per nursing secretary. Wife requested update from MD if possible. Notified Dr. Kerry HoughMemon. Stated would call and speak with them for update. Earnstine RegalAshley Sherise Geerdes, RN

## 2014-02-14 NOTE — Care Management Utilization Note (Signed)
UR completed 

## 2014-02-15 DIAGNOSIS — IMO0002 Reserved for concepts with insufficient information to code with codable children: Principal | ICD-10-CM

## 2014-02-15 DIAGNOSIS — E871 Hypo-osmolality and hyponatremia: Secondary | ICD-10-CM

## 2014-02-15 LAB — BASIC METABOLIC PANEL
Anion gap: 16 — ABNORMAL HIGH (ref 5–15)
BUN: 9 mg/dL (ref 6–23)
CO2: 23 mEq/L (ref 19–32)
Calcium: 9.2 mg/dL (ref 8.4–10.5)
Chloride: 90 mEq/L — ABNORMAL LOW (ref 96–112)
Creatinine, Ser: 0.74 mg/dL (ref 0.50–1.35)
GFR calc Af Amer: >90 mL/min (ref >90)
GLUCOSE: 189 mg/dL — AB (ref 70–99)
Potassium: 4.5 mEq/L (ref 3.7–5.3)
SODIUM: 129 meq/L — AB (ref 137–147)

## 2014-02-15 LAB — URINALYSIS, ROUTINE W REFLEX MICROSCOPIC
BILIRUBIN URINE: NEGATIVE
Glucose, UA: 500 mg/dL — AB
Hgb urine dipstick: NEGATIVE
Ketones, ur: 15 mg/dL — AB
LEUKOCYTES UA: NEGATIVE
Nitrite: NEGATIVE
Protein, ur: NEGATIVE mg/dL
SPECIFIC GRAVITY, URINE: 1.01 (ref 1.005–1.030)
UROBILINOGEN UA: 4 mg/dL — AB (ref 0.0–1.0)
pH: 6 (ref 5.0–8.0)

## 2014-02-15 LAB — GLUCOSE, CAPILLARY: Glucose-Capillary: 208 mg/dL — ABNORMAL HIGH (ref 70–99)

## 2014-02-15 LAB — CBC
HCT: 38.7 % — ABNORMAL LOW (ref 39.0–52.0)
Hemoglobin: 13.1 g/dL (ref 13.0–17.0)
MCH: 32.9 pg (ref 26.0–34.0)
MCHC: 33.9 g/dL (ref 30.0–36.0)
MCV: 97.2 fL (ref 78.0–100.0)
Platelets: 174 10*3/uL (ref 150–400)
RBC: 3.98 MIL/uL — ABNORMAL LOW (ref 4.22–5.81)
RDW: 12.3 % (ref 11.5–15.5)
WBC: 9 10*3/uL (ref 4.0–10.5)

## 2014-02-15 LAB — URIC ACID: Uric Acid, Serum: 4.2 mg/dL (ref 4.0–7.8)

## 2014-02-15 MED ORDER — MAGNESIUM CITRATE PO SOLN
0.5000 | Freq: Once | ORAL | Status: AC
  Administered 2014-02-15: 0.5 via ORAL
  Filled 2014-02-15: qty 296

## 2014-02-15 MED ORDER — BISACODYL 5 MG PO TBEC
10.0000 mg | DELAYED_RELEASE_TABLET | Freq: Once | ORAL | Status: AC
  Administered 2014-02-15: 10 mg via ORAL
  Filled 2014-02-15: qty 2

## 2014-02-15 MED ORDER — INDOMETHACIN 25 MG PO CAPS
75.0000 mg | ORAL_CAPSULE | Freq: Two times a day (BID) | ORAL | Status: DC
  Administered 2014-02-15 – 2014-02-17 (×6): 75 mg via ORAL
  Filled 2014-02-15 (×6): qty 3

## 2014-02-15 MED ORDER — POLYETHYLENE GLYCOL 3350 17 G PO PACK
17.0000 g | PACK | Freq: Every day | ORAL | Status: DC
  Administered 2014-02-15: 17 g via ORAL
  Filled 2014-02-15 (×3): qty 1

## 2014-02-15 MED ORDER — LORAZEPAM 1 MG PO TABS
1.0000 mg | ORAL_TABLET | Freq: Once | ORAL | Status: AC
  Administered 2014-02-15: 1 mg via ORAL
  Filled 2014-02-15: qty 1

## 2014-02-15 MED ORDER — INDOMETHACIN ER 75 MG PO CPCR: 75.0000 mg | ORAL_CAPSULE | Freq: Two times a day (BID) | ORAL | Status: DC

## 2014-02-15 NOTE — Progress Notes (Signed)
02/16/12 1342 Late entry for 1150. Patient and family anxious regarding ortho consult this morning. Called Dr. Romeo AppleHarrison to make sure he would see patient today. Stated would see patient tomorrow. Consult placed and called yesterday by nursing secretary. Dr. Romeo AppleHarrison added to treatment team yesterday with consult order. Notified Dr. Kerry HoughMemon. Earnstine RegalAshley Richrd Kuzniar, RN

## 2014-02-15 NOTE — Progress Notes (Signed)
02/15/14 1119 Patient c/o left knee pain on assessment this morning. Stated "it's my gout." states has taken allopurinol for gout in the past, "every day when I took it like I was supposed to". Some edema noted to left knee, pt unable to bear weight as well to left leg d/t pain. Notified Dr. Kerry HoughMemon of c/o gout. Orders placed to start indomethacin. Given as ordered, discussed with patient. Instructed to call for assistance and not get up on his own for safety. Stated would call. Pt later requested "an emergency laxative, that will work quick". On reassessment, stated "my back is hurting and I think it's because I need a laxative. Last BM reported as 02/12/14. Bowel sounds present x 4 quadrants. No c/o abdominal pain. Notified MD. Orders placed for dulcolax and miralax. Given as ordered. Earnstine RegalAshley Cristina Ceniceros, RN

## 2014-02-15 NOTE — Progress Notes (Signed)
02/15/14 1741 Patient reported bowel movement this evening, stated "I could still go, still want something to take". Notified Dr. Kerry HoughMemon. Order received for magnesium citrate 1/2 bottle, may repeat with other 1/2 bottle if no results within an hour. Earnstine RegalAshley Patrick Salemi, RN

## 2014-02-15 NOTE — Progress Notes (Signed)
[facility] PROGRESS NOTE  Levie HeritageClint L Vanderschaaf ZOX:[id number] DOB: 11-Dec-1965 DOA: 02/13/2014 PCP: No primary provider on file.  Assessment/Plan: 1. Right elbow cellulitis. Concern for underlying joint infection. Currently on Clindamycin and clinically appears to be improving. Keep right arm elevated. I have requested orthopedic consultation to see if joint aspiration is required. Informed by staff that Dr. Romeo AppleHarrison will be in tomorrow morning to see patient. Updated patient who is agreeable. He does not have any leukocytosis and has not had any fevers since being in the hospital. He does have a history of gout, but reports last attack was approximately 10 years ago. 2. Left knee pain.  Left knee appears to be mildly swollen and feels warm.  No erythema. Decreased range of motion. Uric acid was checked and found to be normal.  Although this does not completely rule out gout. Patient started on indomethacin.  Will continue to follow. Await ortho input. 3. Diabetes. Continue sliding scale insulin. Metformin on hold. 4. Hypertension. Continue outpatient regimen 5. Low back pain. Possibly musculoskeletal. Patient reports pain in right flank that is worse with movement.  Feels that it may be related to constipation. Check urinalysis 6. Hyponatremia. Unclear cause, possible lab anomaly.  Recheck in am.   Code Status: full code Family Communication: discussed with patient and family at the bedside Disposition Plan: discharge home once improved   Consultants:  Orthopedic pending  Procedures:    Antibiotics:  vancomyin 7/2, discontinued due to allergic reaction  Zosyn 7/2>>7/3  Clindamycin 7/3>>  HPI/Subjective: Continued pain in right elbow, decreased range of motion  Objective: Filed Vitals:   02/15/14 0631  BP: 129/83  Pulse: 77  Temp: 98.5 F (36.9 C)  Resp: 20    Intake/Output Summary (Last 24 hours) at 02/15/14 1240 Last data filed at 02/15/14 0900  Gross per 24 hour   Intake   2260 ml  Output   2200 ml  Net     60 ml   Filed Weights   02/13/14 1159  Weight: 112.946 kg (249 lb)    Exam:   General:  NAD  Cardiovascular: S1, S2 RRR  Respiratory: CTA B  Abdomen: soft, nt, nd, bs+  Musculoskeletal: right elbow edema/effusion and erythema persist.  Improving range of motion. No discharge noted.  Data Reviewed: Basic Metabolic Panel:  Recent Labs Lab 02/13/14 1225 02/14/14 0546 02/15/14 0539  NA 138 137 129*  K 3.8 4.2 4.5  CL 99 100 90*  CO2 25 23 23   GLUCOSE 170* 161* 189*  BUN 10 11 9   CREATININE 0.83 0.89 0.74  CALCIUM 9.1 8.9 9.2   Liver Function Tests:  Recent Labs Lab 02/14/14 0546  AST 26  ALT 27  ALKPHOS 88  BILITOT 0.5  PROT 6.6  ALBUMIN 2.9*   No results found for this basename: LIPASE, AMYLASE,  in the last 168 hours No results found for this basename: AMMONIA,  in the last 168 hours CBC:  Recent Labs Lab 02/13/14 1225 02/14/14 0546 02/15/14 0539  WBC 9.1 7.8 9.0  NEUTROABS 6.5  --   --   HGB 12.8* 12.5* 13.1  HCT 37.5* 36.8* 38.7*  MCV 96.2 97.9 97.2  PLT 152 153 174   Cardiac Enzymes: No results found for this basename: CKTOTAL, CKMB, CKMBINDEX, TROPONINI,  in the last 168 hours BNP (last 3 results) No results found for this basename: PROBNP,  in the last 8760 hours CBG:  Recent Labs Lab 02/13/14 1402 02/14/14 0733 02/14/14 1623 02/14/14 2232  GLUCAP 159* 136* 179* 159*    No results found for this or any previous visit (from the past 240 hour(s)).   Studies: Dg Elbow Complete Right  02/13/2014   CLINICAL DATA:  Elbow pain, erythema and swelling.  No acute injury.  EXAM: RIGHT ELBOW - COMPLETE 3+ VIEW  COMPARISON:  None.  FINDINGS: The mineralization and alignment are normal. There is no evidence of acute fracture or dislocation. The joint spaces are maintained. There is spurring of the olecranon and coronoid processes. There is no elbow joint effusion. Subcutaneous edema is noted  surrounding the elbow, especially posteriorly. There appears to be some swelling over the olecranon process, suggesting possible olecranon bursitis.  IMPRESSION: 1. No acute osseous findings. 2. Soft tissue swelling around the elbow, especially posteriorly. Consider olecranon bursitis.   Electronically Signed   By: Roxy HorsemanBill  Veazey M.D.   On: 02/13/2014 16:25    Scheduled Meds: . clindamycin (CLEOCIN) IV  600 mg Intravenous 3 times per day  . heparin  5,000 Units Subcutaneous 3 times per day  . indomethacin  75 mg Oral BID WC  . insulin aspart  0-15 Units Subcutaneous TID WC  . insulin aspart  0-5 Units Subcutaneous QHS  . losartan  50 mg Oral Daily  . polyethylene glycol  17 g Oral Daily   Continuous Infusions: . sodium chloride 50 mL/hr at 02/14/14 2038    Active Problems:   Diabetes mellitus   Cellulitis of right arm   Hypertension   Cellulitis of arm    Time spent: 35mins    Dwana Garin  [facility] Pager [phone]. If 7PM-7AM, please contact night-coverage at [email or web address], password [email or web address] 02/15/2014, 12:40 PM  LOS: 2 days

## 2014-02-15 NOTE — Progress Notes (Signed)
02/15/14 1700 Patient reports no bowel movement today after dulcolax and miralax as ordered this am and prune juice his family brought him. States less back discomfort, but requested "something else to help me go". Text-paged Dr. Kerry HoughMemon to notify. Earnstine RegalAshley Mickle Campton, RN

## 2014-02-16 ENCOUNTER — Encounter (HOSPITAL_COMMUNITY): Payer: Self-pay

## 2014-02-16 DIAGNOSIS — M7020 Olecranon bursitis, unspecified elbow: Secondary | ICD-10-CM

## 2014-02-16 DIAGNOSIS — M25469 Effusion, unspecified knee: Secondary | ICD-10-CM

## 2014-02-16 LAB — BASIC METABOLIC PANEL
ANION GAP: 14 (ref 5–15)
BUN: 13 mg/dL (ref 6–23)
CO2: 27 mEq/L (ref 19–32)
Calcium: 9.4 mg/dL (ref 8.4–10.5)
Chloride: 97 mEq/L (ref 96–112)
Creatinine, Ser: 0.78 mg/dL (ref 0.50–1.35)
GFR calc non Af Amer: >90 mL/min (ref >90)
Glucose, Bld: 162 mg/dL — ABNORMAL HIGH (ref 70–99)
POTASSIUM: 4.2 meq/L (ref 3.7–5.3)
Sodium: 138 mEq/L (ref 137–147)

## 2014-02-16 LAB — GLUCOSE, CAPILLARY
GLUCOSE-CAPILLARY: 157 mg/dL — AB (ref 70–99)
GLUCOSE-CAPILLARY: 197 mg/dL — AB (ref 70–99)
Glucose-Capillary: 185 mg/dL — ABNORMAL HIGH (ref 70–99)
Glucose-Capillary: 190 mg/dL — ABNORMAL HIGH (ref 70–99)
Glucose-Capillary: 165 mg/dL — ABNORMAL HIGH (ref 70–99)
Glucose-Capillary: 273 mg/dL — ABNORMAL HIGH (ref 70–99)

## 2014-02-16 MED ORDER — SODIUM CHLORIDE 0.9 % IJ SOLN
10.0000 mL | Freq: Two times a day (BID) | INTRAMUSCULAR | Status: DC
  Administered 2014-02-17 – 2014-02-18 (×2): 10 mL

## 2014-02-16 MED ORDER — METHYLPREDNISOLONE SODIUM SUCC 40 MG IJ SOLR
40.0000 mg | Freq: Four times a day (QID) | INTRAMUSCULAR | Status: AC
  Administered 2014-02-16 – 2014-02-17 (×4): 40 mg via INTRAVENOUS
  Filled 2014-02-16 (×4): qty 1

## 2014-02-16 MED ORDER — SODIUM CHLORIDE 0.9 % IJ SOLN
10.0000 mL | Freq: Two times a day (BID) | INTRAMUSCULAR | Status: DC
  Administered 2014-02-16 – 2014-02-18 (×5): 10 mL

## 2014-02-16 MED ORDER — SODIUM CHLORIDE 0.9 % IJ SOLN: 10.0000 mL | INTRAMUSCULAR | Status: DC | PRN

## 2014-02-16 MED ORDER — METHYLPREDNISOLONE ACETATE 40 MG/ML IJ SUSP
40.0000 mg | Freq: Once | INTRAMUSCULAR | Status: AC
  Administered 2014-02-16: 40 mg via INTRA_ARTICULAR
  Filled 2014-02-16: qty 5

## 2014-02-16 MED ORDER — DIPHENHYDRAMINE HCL 50 MG/ML IJ SOLN
25.0000 mg | Freq: Two times a day (BID) | INTRAMUSCULAR | Status: DC
  Administered 2014-02-16 – 2014-02-17 (×4): 25 mg via INTRAVENOUS
  Filled 2014-02-16 (×5): qty 1

## 2014-02-16 MED ORDER — COLCHICINE 0.6 MG PO TABS
0.6000 mg | ORAL_TABLET | Freq: Four times a day (QID) | ORAL | Status: AC
  Administered 2014-02-16 – 2014-02-18 (×8): 0.6 mg via ORAL
  Filled 2014-02-16 (×8): qty 1

## 2014-02-16 MED ORDER — VANCOMYCIN HCL IN DEXTROSE 1-5 GM/200ML-% IV SOLN
1000.0000 mg | Freq: Two times a day (BID) | INTRAVENOUS | Status: DC
  Administered 2014-02-16 – 2014-02-18 (×5): 1000 mg via INTRAVENOUS
  Filled 2014-02-16 (×7): qty 200

## 2014-02-16 NOTE — Progress Notes (Signed)
02/16/14 1501 Notified Dr. Kerry HoughMemon patient tolerating vancomycin with no complaints. Stated 1400 dose of clindamycin not to be given, may be d/c'd later today if patient continues to tolerate vancomycin. Earnstine RegalAshley Edie Darley, RN

## 2014-02-16 NOTE — Progress Notes (Signed)
[facility] PROGRESS NOTE  Allen Kaiser XBJ:[id number] DOB: 11-11-1965 DOA: 02/13/2014 PCP: No primary provider on file.  Assessment/Plan: 1. Right elbow cellulitis. Evaluated by Dr. Romeo AppleHarrison and right elbow was aspirated. The aspirate revealed blood and gout tophus. It is felt that his right elbow edema and erythema is likely related to gout with superimposed cellulitis. He has been restarted on vancomycin. If he tolerates this without any allergic reaction, we'll discontinue clindamycin. He will likely need further IV antibiotics 2. Left knee pain.  Felt to be related to acute gout. Joint was aspirated and injected with Depo-Medrol. Continue Indocin 3. Diabetes. Continue sliding scale insulin. Metformin on hold. 4. Hypertension. Continue outpatient regimen 5. Low back pain. Possibly musculoskeletal. Improved after bowel movement    Code Status: full code Family Communication: discussed with patient and family at the bedside Disposition Plan: discharge home once improved   Consultants:  Orthopedic   Procedures:  Joint aspiration of right elbow by Dr. Romeo AppleHarrison  Joint aspiration and injection of Solu-Medrol in to left knee by Dr. Romeo AppleHarrison  Antibiotics:  vancomyin 7/2, discontinued due to allergic reaction, we started on 7/5  Zosyn 7/2>>7/3  Clindamycin 7/3>>  HPI/Subjective: Although is feeling better after joint aspiration. His knee is also feeling improved  Objective: Filed Vitals:   02/16/14 1459  BP: 132/81  Pulse: 75  Temp: 98.7 F (37.1 C)  Resp: 20    Intake/Output Summary (Last 24 hours) at 02/16/14 1840 Last data filed at 02/16/14 1700  Gross per 24 hour  Intake 2137.5 ml  Output    800 ml  Net 1337.5 ml   Filed Weights   02/13/14 1159  Weight: 112.946 kg (249 lb)    Exam:   General:  NAD  Cardiovascular: S1, S2 RRR  Respiratory: CTA B  Abdomen: soft, nt, nd, bs+  Musculoskeletal: right elbow edema/effusion improves post  arthrocentesis  Improving range of motion.   Data Reviewed: Basic Metabolic Panel:  Recent Labs Lab 02/13/14 1225 02/14/14 0546 02/15/14 0539 02/16/14 0517  NA 138 137 129* 138  K 3.8 4.2 4.5 4.2  CL 99 100 90* 97  CO2 25 23 23 27   GLUCOSE 170* 161* 189* 162*  BUN 10 11 9 13   CREATININE 0.83 0.89 0.74 0.78  CALCIUM 9.1 8.9 9.2 9.4   Liver Function Tests:  Recent Labs Lab 02/14/14 0546  AST 26  ALT 27  ALKPHOS 88  BILITOT 0.5  PROT 6.6  ALBUMIN 2.9*   No results found for this basename: LIPASE, AMYLASE,  in the last 168 hours No results found for this basename: AMMONIA,  in the last 168 hours CBC:  Recent Labs Lab 02/13/14 1225 02/14/14 0546 02/15/14 0539  WBC 9.1 7.8 9.0  NEUTROABS 6.5  --   --   HGB 12.8* 12.5* 13.1  HCT 37.5* 36.8* 38.7*  MCV 96.2 97.9 97.2  PLT 152 153 174   Cardiac Enzymes: No results found for this basename: CKTOTAL, CKMB, CKMBINDEX, TROPONINI,  in the last 168 hours BNP (last 3 results) No results found for this basename: PROBNP,  in the last 8760 hours CBG:  Recent Labs Lab 02/15/14 1617 02/15/14 2223 02/16/14 0732 02/16/14 1108 02/16/14 1632  GLUCAP 190* 208* 165* 273* 197*    No results found for this or any previous visit (from the past 240 hour(s)).   Studies: No results found.  Scheduled Meds: . clindamycin (CLEOCIN) IV  600 mg Intravenous 3 times per day  . colchicine  0.6 mg Oral Q6H  . diphenhydrAMINE  25 mg Intravenous Q12H  . heparin  5,000 Units Subcutaneous 3 times per day  . indomethacin  75 mg Oral BID WC  . insulin aspart  0-15 Units Subcutaneous TID WC  . insulin aspart  0-5 Units Subcutaneous QHS  . losartan  50 mg Oral Daily  . methylPREDNISolone (SOLU-MEDROL) injection  40 mg Intravenous Q6H  . polyethylene glycol  17 g Oral Daily  . sodium chloride  10-40 mL Intracatheter Q12H  . sodium chloride  10-40 mL Intracatheter Q12H  . vancomycin  1,000 mg Intravenous Q12H   Continuous Infusions: .  sodium chloride 50 mL/hr at 02/16/14 1227    Active Problems:   Diabetes mellitus   Cellulitis of right arm   Hypertension   Cellulitis of arm    Time spent: 35mins    Allen Kaiser  [facility] Pager [phone]. If 7PM-7AM, please contact night-coverage at [email or web address], password [email or web address] 02/16/2014, 6:40 PM  LOS: 3 days

## 2014-02-16 NOTE — Consult Note (Signed)
Reason for Consult: right elbow  Referring Physician: Dr. Alease Kaiser   Allen Kaiser is an 48 y.o. male.  HPI: Chief Complaint: Cellulitis  HPI: Allen Kaiser is a 48 y.o. male diabetic who states that he has had redness and swelling of his right arm since about Monday. He states that he was seen in the PCP office on wednsday and was given antibiotics. He notes that the redness was getting worse. [name] Patient states that he is a diabetic and takes metformin. He does not keep track of his blood glucose levels. In addition he states that he has not had any trauma to the elbow or arm and has not had any recent travel  Review of Systems:  Constitutional:   No weight loss, night sweats, Fevers, chills HEENT:   No headaches   Cardio-vascular:   No chest pain, Orthopnea, PND, swelling in lower extremities, anasarca, dizziness, palpitations   GI:   No heartburn, indigestion, abdominal pain, nausea, vomiting, diarrhea, change in bowel habits, loss of appetite   Resp:   No shortness of breath with exertion or at rest. No excess mucus, no productive cough, No non-productive cough, No coughing up of blood.No change in color of mucus.No wheezing.No chest wall deformity   Skin:   ++rash lesion swelling noted.   GU:   no dysuria, change in color of urine, no urgency or frequency. No flank pain.   Musculoskeletal:   ++joint pain ++swelling. No back pain.   Psych:   No change in mood or affect. No depression   Past Medical History  Diagnosis Date  . Diabetes mellitus   . Hypertension     Past Surgical History  Procedure Laterality Date  . Hernia repair    . Fracture surgery    . Cardiac catheterization      History reviewed. No pertinent family history.  Social History:  reports that he has never smoked. He does not have any smokeless  tobacco history on file. He reports that he drinks alcohol. He reports that he does not use illicit drugs.  Allergies:  Allergies  Allergen Reactions  . Vancomycin Hives    Hives resolved with pepcid and benadryl    Medications: I have reviewed the patient's current medications.  ROS Review of Systems:  Constitutional:   No weight loss, night sweats, Fevers, chills HEENT:   No headaches   Cardio-vascular:   No chest pain, Orthopnea, PND, swelling in lower extremities, anasarca, dizziness, palpitations   GI:   No heartburn, indigestion, abdominal pain, nausea, vomiting, diarrhea, change in bowel habits, loss of appetite   Resp:   No shortness of breath with exertion or at rest. No excess mucus, no productive cough, No non-productive cough, No coughing up of blood.No change in color of mucus.No wheezing.No chest wall deformity   Skin:   ++rash lesion swelling noted.   GU:   no dysuria, change in color of urine, no urgency or frequency. No flank pain.   Musculoskeletal:   ++joint pain ++swelling. No back pain.   Psych:   No change in mood or affect. No depression   Blood pressure 130/75, pulse 73, temperature 98.5 F (36.9 C), temperature source Oral, resp. rate 20, height 6\' 2"  (1.88 m), weight 249 lb (112.946 kg), SpO2 100.00%. Physical Exam   Vital signs are  stable as recorded  General appearance is normal, body habitus large  The patient is alert and oriented x 3  The patient's mood and affect are normal  Gait assessment: Difficulty ambulating secondary to pain and swelling left knee  The cardiovascular exam reveals normal pulses and temperature without edema or  swelling.  The lymphatic system is negative for palpable lymph nodes  The sensory exam is normal.  There are no pathologic reflexes.  Balance is normal.   Exam of the right elbow and right upper extremity He has redness and induration around the forearm and elbow with a large amount of redness  around the elbow with a large sac of fluid Painful range of motion of the elbow limited 2 100. Stability normal. Muscle tone normal. Skin red indurated. Lymph nodes axilla normal.  Left knee Inspection large joint effusion Range of motion knee held at 30 flexion painful range of motion Stability ligaments are stable Strength grade 5 motor strength  Skin normal, no rash, or laceration.  Right lower cavity normal, left upper extremity normal  A/P FINDINGS: The mineralization and alignment are normal. There is no evidence of acute fracture or dislocation. The joint spaces are maintained. There is spurring of the olecranon and coronoid processes. There is no elbow joint effusion. Subcutaneous edema is noted surrounding the elbow, especially posteriorly. There appears to be some swelling over the olecranon process, suggesting possible olecranon bursitis.   IMPRESSION: 1. No acute osseous findings. 2. Soft tissue swelling around the elbow, especially posteriorly. Consider olecranon bursitis.     Assessment/Plan: #1 right elbow and right arm cellulitis with gout  # 2 left knee effusion:    Right elbow was aspirated and significant amount of gout tophus was noted.  Left knee aspiration cloudy  synovial fluid, followed by injection of 40 mg of Depo-Medrol into the knee joint  Recommend IV antibiotics with a PICC line, vancomycin with Benadryl.  The reaction in the emergency room occurred after the blood pressure cuff was placed above the IV. There was no other rash itching in any other part of his body. He has agreed to try Benadryl and repeat vancomycin.  Allen Kaiser    WBC normal   Glucose 150-200  xrays : Right elbow FINDINGS: The mineralization and alignment are normal. There is no evidence of acute fracture or dislocation. The joint spaces are maintained. There is spurring of the olecranon and coronoid processes. There is no elbow joint effusion. Subcutaneous  edema is noted surrounding the elbow, especially posteriorly. There appears to be some swelling over the olecranon process, suggesting possible olecranon bursitis.   IMPRESSION: 1. No acute osseous findings. 2. Soft tissue swelling around the elbow, especially posteriorly. Consider olecranon bursitis.      02/16/2014, 8:32 AM   Procedure #1 aspiration right elbow Alcohol was used to prep the skin and then an 18-gauge needle was used to aspirate the elbow Consent.  Timeout to confirm site.  LEFT elbow was aspirated. Aspirate revealed blood and gout tophus  There were no complications  Procedure #2 aspiration injection left knee Aspiration LEFT knee.  Verbal consent was obtained, timeout was completed.  Sterile prep of the LEFT knee.  Lateral approach, suprapatellar  Aspiration of  70 cc cloudy yellow fluid   Injection of Depo-Medrol 40 mg   No complications.

## 2014-02-16 NOTE — Progress Notes (Signed)
Peripherally Inserted Central Catheter/Midline Placement  The IV Nurse has discussed with the patient and/or persons authorized to consent for the patient, the purpose of this procedure and the potential benefits and risks involved with this procedure.  The benefits include less needle sticks, lab draws from the catheter and patient may be discharged home with the catheter.  Risks include, but not limited to, infection, bleeding, blood clot (thrombus formation), and puncture of an artery; nerve damage and irregular heat beat.  Alternatives to this procedure were also discussed.  PICC/Midline Placement Documentation  PICC / Midline Single Lumen 02/16/14 PICC Left Basilic 45 cm 1 cm (Active)  Indication for Insertion or Continuance of Line Limited venous access - need for IV therapy >5 days (PICC only) 02/16/2014 12:24 PM  Exposed Catheter (cm) 1 cm 02/16/2014 12:22 PM  Site Assessment Clean;Dry;Intact 02/16/2014 12:24 PM  Line Status Infusing;Flushed;Blood return noted 02/16/2014 12:24 PM  Dressing Type Transparent;Securing device 02/16/2014 12:24 PM  Dressing Status Clean;Dry;Intact;Antimicrobial disc in place 02/16/2014 12:24 PM  Line Care Connections checked and tightened;Tubing changed 02/16/2014 12:24 PM  Dressing Intervention New dressing 02/16/2014 12:22 PM  Dressing Change Due 02/23/14 02/16/2014 12:22 PM       Myra RudeWitherspoon, Aerielle Stoklosa S 02/16/2014, 12:26 PM PICC Line Insertion Procedure Note  Procedure: Insertion of #5FR PICC  Indications:  Poor Access  Procedure Details  Informed consent was obtained for the procedure, including sedation.  Risks of lung perforation, hemorrhage, and adverse drug reaction were discussed.   Maximum sterile technique was used including antiseptics, cap, gloves, gown, hand hygiene, mask and sheet.  #5FR PICC inserted to the L Basilic vein per hospital protocol.   Blood return:  yes  Findings: Catheter inserted to 45 cm, with 1 cm. Exposed.   There were no changes to  vital signs. Catheter was flushed with 10 cc NS. Patient did tolerate procedure well.  Recommendations: CXR ordered to verify placement. PICC Brochure given to patient with teaching instruction.

## 2014-02-16 NOTE — Progress Notes (Signed)
02/16/14 1300 Okay to give vancomycin as ordered per Dr. Romeo AppleHarrison and Dr. Kerry HoughMemon. Solumedrol and benadryl given prior to vancomycin as ordered, see MAR. Started vancomycin at 50 ml/hour to run slowly for first dose, monitor for any reaction. Instructed patient to notify nursing staff of any itching, rash, hives, etc. Stated understood and okay with vancomycin as ordered. Earnstine RegalAshley Jerrit Horen, RN

## 2014-02-17 LAB — GLUCOSE, CAPILLARY
GLUCOSE-CAPILLARY: 228 mg/dL — AB (ref 70–99)
GLUCOSE-CAPILLARY: 245 mg/dL — AB (ref 70–99)
Glucose-Capillary: 296 mg/dL — ABNORMAL HIGH (ref 70–99)
Glucose-Capillary: 292 mg/dL — ABNORMAL HIGH (ref 70–99)

## 2014-02-17 NOTE — Progress Notes (Signed)
[facility] PROGRESS NOTE  Allen HeritageClint L Kaiser [id number] DOB: Dec 28, 1965 DOA: 02/13/2014 PCP: No primary provider on file.  Assessment/Plan: 1. Right elbow cellulitis. Evaluated by Dr. Romeo AppleHarrison and right elbow was aspirated. The aspirate revealed blood and gout tophus. It is felt that his right elbow edema and erythema is likely related to gout with superimposed cellulitis. He has been restarted on vancomycin and intravenous steroids. Clinically improving. He will likely need further IV antibiotics. He has a PICC line placed will likely need to go home IV antibiotics. 2. Left knee pain.  Felt to be related to acute gout. Joint was aspirated and injected with Depo-Medrol. He is also on colchicine 3. Diabetes. Continue sliding scale insulin. Metformin on hold. 4. Hypertension. Continue outpatient regimen 5. Low back pain. Possibly musculoskeletal. Improved after bowel movement    Code Status: full code Family Communication: discussed with patient and family at the bedside Disposition Plan: discharge home once improved   Consultants:  Orthopedic   Procedures:  Joint aspiration of right elbow by Dr. Romeo AppleHarrison  Joint aspiration and injection of Solu-Medrol in to left knee by Dr. Romeo AppleHarrison  PICC line placement  Antibiotics:  vancomyin 7/2, discontinued due to allergic reaction, we started on 7/5  Zosyn 7/2>>7/3  Clindamycin 7/3>>7/5  HPI/Subjective: Feeling better. Joints are less tender. Able to stand and bear weight on his knee  Objective: Filed Vitals:   02/17/14 1424  BP: 137/71  Pulse: 67  Temp: 97.8 F (36.6 C)  Resp: 20    Intake/Output Summary (Last 24 hours) at 02/17/14 2009 Last data filed at 02/17/14 1726  Gross per 24 hour  Intake 2493.33 ml  Output      0 ml  Net 2493.33 ml   Filed Weights   02/13/14 1159  Weight: 112.946 kg (249 lb)    Exam:   General:  NAD  Cardiovascular: S1, S2 RRR  Respiratory: CTA B  Abdomen: soft, nt, nd,  bs+  Musculoskeletal: right elbow edema/effusion further improving  Improving range of motion, less tenderness. Left knee range of motion has significantly improved, patient is able to stand  Data Reviewed: Basic Metabolic Panel:  Recent Labs Lab 02/13/14 1225 02/14/14 0546 02/15/14 0539 02/16/14 0517  NA 138 137 129* 138  K 3.8 4.2 4.5 4.2  CL 99 100 90* 97  CO2 25 23 23 27   GLUCOSE 170* 161* 189* 162*  BUN 10 11 9 13   CREATININE 0.83 0.89 0.74 0.78  CALCIUM 9.1 8.9 9.2 9.4   Liver Function Tests:  Recent Labs Lab 02/14/14 0546  AST 26  ALT 27  ALKPHOS 88  BILITOT 0.5  PROT 6.6  ALBUMIN 2.9*   No results found for this basename: LIPASE, AMYLASE,  in the last 168 hours No results found for this basename: AMMONIA,  in the last 168 hours CBC:  Recent Labs Lab 02/13/14 1225 02/14/14 0546 02/15/14 0539  WBC 9.1 7.8 9.0  NEUTROABS 6.5  --   --   HGB 12.8* 12.5* 13.1  HCT 37.5* 36.8* 38.7*  MCV 96.2 97.9 97.2  PLT 152 153 174   Cardiac Enzymes: No results found for this basename: CKTOTAL, CKMB, CKMBINDEX, TROPONINI,  in the last 168 hours BNP (last 3 results) No results found for this basename: PROBNP,  in the last 8760 hours CBG:  Recent Labs Lab 02/16/14 1108 02/16/14 1632 02/17/14 0713 02/17/14 1109 02/17/14 1618  GLUCAP 273* 197* 228* 296* 292*    No results found for this or any  previous visit (from the past 240 hour(s)).   Studies: No results found.  Scheduled Meds: . clindamycin (CLEOCIN) IV  600 mg Intravenous 3 times per day  . colchicine  0.6 mg Oral Q6H  . diphenhydrAMINE  25 mg Intravenous Q12H  . heparin  5,000 Units Subcutaneous 3 times per day  . indomethacin  75 mg Oral BID WC  . insulin aspart  0-15 Units Subcutaneous TID WC  . insulin aspart  0-5 Units Subcutaneous QHS  . losartan  50 mg Oral Daily  . polyethylene glycol  17 g Oral Daily  . sodium chloride  10-40 mL Intracatheter Q12H  . sodium chloride  10-40 mL  Intracatheter Q12H  . vancomycin  1,000 mg Intravenous Q12H   Continuous Infusions: . sodium chloride 50 mL/hr at 02/16/14 1227    Active Problems:   Diabetes mellitus   Cellulitis of right arm   Hypertension   Cellulitis of arm    Time spent: 35mins    Nickole Adamek  [facility] Pager [phone]. If 7PM-7AM, please contact night-coverage at [email or web address], password [email or web address] 02/17/2014, 8:09 PM  LOS: 4 days

## 2014-02-17 NOTE — Care Management Note (Addendum)
    Page 1 of 2   02/18/2014     10:27:01 AM CARE MANAGEMENT NOTE 02/18/2014  Patient:  Levie HeritageDANIEL,Dolphus L   Account Number:  [id number]  Date Initiated:  02/17/2014  Documentation initiated by:  Sharrie RothmanBLACKWELL,Prabhleen Montemayor C  Subjective/Objective Assessment:   Pt admitted from home with cellulitis. Pt lives with his wife and will return home at discharge. Pt is independent with ADL's.     Action/Plan:   PCP appt made with Karilyn CotaGosrani and documented on AVS.   Anticipated DC Date:  02/18/2014   Anticipated DC Plan:  HOME/SELF CARE      DC Planning Services  CM consult  PCP issues      [facility] Choice  HOME HEALTH   Choice offered to / List presented to:  C-1 Patient        HH arranged  HH-1 RN  IV Antibiotics      HH agency  [facility]   Status of service:  Completed, signed off Medicare Important Message given?   (If response is "NO", the following Medicare IM given date fields will be blank) Date Medicare IM given:   Medicare IM given by:   Date Additional Medicare IM given:   Additional Medicare IM given by:    Discharge Disposition:  HOME/SELF CARE  Per UR Regulation:    If discussed at Long Length of Stay Meetings, dates discussed:   02/18/2014    Comments:  02/18/14 1025 Arlyss Queenammy Colena Ketterman, RN BSN CM [facility]. [facility] is aware and will collect the pts information from the chart. HH services to start today once pt discharged home. No DME needs noted. Pt and pts nurse aware of discharge arrangements.  02/17/14 1410 Arlyss Queenammy Dawid Dupriest, RN BSN CM

## 2014-02-17 NOTE — Progress Notes (Signed)
Pt has had no signs of reaction with Vancomycin infusion.

## 2014-02-18 DIAGNOSIS — M109 Gout, unspecified: Secondary | ICD-10-CM | POA: Diagnosis present

## 2014-02-18 LAB — BASIC METABOLIC PANEL
Anion gap: 11 (ref 5–15)
BUN: 19 mg/dL (ref 6–23)
CO2: 27 meq/L (ref 19–32)
Calcium: 8.9 mg/dL (ref 8.4–10.5)
Chloride: 102 mEq/L (ref 96–112)
Creatinine, Ser: 0.86 mg/dL (ref 0.50–1.35)
GFR calc Af Amer: >90 mL/min (ref >90)
GFR calc non Af Amer: >90 mL/min (ref >90)
GLUCOSE: 223 mg/dL — AB (ref 70–99)
POTASSIUM: 4.7 meq/L (ref 3.7–5.3)
SODIUM: 140 meq/L (ref 137–147)

## 2014-02-18 LAB — GLUCOSE, CAPILLARY: GLUCOSE-CAPILLARY: 184 mg/dL — AB (ref 70–99)

## 2014-02-18 MED ORDER — VANCOMYCIN HCL IN DEXTROSE 1-5 GM/200ML-% IV SOLN: 1000.0000 mg | Freq: Two times a day (BID) | INTRAVENOUS | Status: AC

## 2014-02-18 NOTE — Progress Notes (Signed)
Patient going home on vancomycin. Patient has been receiving benadryl before vancomycin to avoid reaction. Discussed with Dr. Kerry HoughMemon. Will give vancomycin without benadryl this morning,

## 2014-02-18 NOTE — Discharge Instructions (Signed)
Cellulitis Cellulitis is an infection of the skin and the tissue beneath it. The infected area is usually red and tender. Cellulitis occurs most often in the arms and lower legs.  CAUSES  Cellulitis is caused by bacteria that enter the skin through cracks or cuts in the skin. The most common types of bacteria that cause cellulitis are Staphylococcus and Streptococcus. SYMPTOMS   Redness and warmth.  Swelling.  Tenderness or pain.  Fever. DIAGNOSIS  Your caregiver can usually determine what is wrong based on a physical exam. Blood tests may also be done. TREATMENT  Treatment usually involves taking an antibiotic medicine. HOME CARE INSTRUCTIONS   Take your antibiotics as directed. Finish them even if you start to feel better.  Keep the infected arm or leg elevated to reduce swelling.  Apply a warm cloth to the affected area up to 4 times per day to relieve pain.  Only take over-the-counter or prescription medicines for pain, discomfort, or fever as directed by your caregiver.  Keep all follow-up appointments as directed by your caregiver. SEEK MEDICAL CARE IF:   You notice red streaks coming from the infected area.  Your red area gets larger or turns dark in color.  Your bone or joint underneath the infected area becomes painful after the skin has healed.  Your infection returns in the same area or another area.  You notice a swollen bump in the infected area.  You develop new symptoms. SEEK IMMEDIATE MEDICAL CARE IF:   You have a fever.  You feel very sleepy.  You develop vomiting or diarrhea.  You have a general ill feeling (malaise) with muscle aches and pains. MAKE SURE YOU:   Understand these instructions.  Will watch your condition.  Will get help right away if you are not doing well or get worse. Document Released: 05/11/2005 Document Revised: 01/31/2012 Document Reviewed: 10/17/2011 Exit[employer] Patient Information 2015 [employer]. This information is  not intended to replace advice given to you by your health care provider. Make sure you discuss any questions you have with your health care provider.  

## 2014-02-18 NOTE — Progress Notes (Signed)
PICC line flushed. Home health set up. Discharge instructions reviewed with patient. Understanding verbalized. Taken out via wheelchair for discharge home

## 2014-02-18 NOTE — Discharge Summary (Signed)
Physician Discharge Summary  Allen Kaiser ZHY:[id number] DOB: [birth year] DOA: 02/13/2014  PCP: No primary provider on file.  Admit date: 02/13/2014 Discharge date: 02/18/2014  Time spent: 40 minutes  Recommendations for Outpatient Follow-up:  1. Patient will be discharged on intravenous vancomycin for another 4 days. 2. Follow up with primary care doctor in 2 weeks  Discharge Diagnoses:  Principal Problem:   Cellulitis of right arm Active Problems:   Diabetes mellitus   Hypertension   Cellulitis of arm   Acute gout   Discharge Condition: improved  Diet recommendation: low salt, low carb  Filed Weights   02/13/14 1159  Weight: 112.946 kg (249 lb)    History of present illness:  Allen Kaiser is a 48 y.o. male diabetic who states that he has had redness and swelling of his right arm since about Monday. He states that he was seen in the PCP office on wednsday and was given antibiotics. He notes that the redness was getting worse. [name] Patient states that he is a diabetic and takes metformin. He does not keep track of his blood glucose levels. In addition he states that he has not had any trauma to the elbow or arm and has not had any recent travel   Hospital Course:  Patient was admitted with right elbow cellulitis and associated effusion.  He started on empiric antibiotics with some improvement. 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 The patient was continued on antibiotics and his erythema around the elbow has significantly improved. Joint effusion has also improved with aspiration. His range of motion is improving tenderness has resolved. It was felt that his right elbow erythema/effusion was likely related to an acute gout flare with superimposed cellulitis. The patient also developed  pain and effusion of his left knee. This was also aspirated by orthopedics and injected with Depo-Medrol. Patient had significant improvement. This was also felt to be related to gout. Patient is significantly improved and is ready to discharge home. He will need another 3-4 days of intravenous antibiotics. PICC line has been placed prior to discharge. The patient will be discharged home on vancomycin. It was reported that he may have an allergy to vancomycin when he developed redness in his arm after initial infusion. This confusion was done in the arm with a blood pressure cuff was being inflated. Vancomycin was repeated during his hospital stay and he did not have any significant allergic reaction.  Procedures: Joint aspiration of right elbow by Dr. Romeo AppleHarrison  Joint aspiration and injection of Solu-Medrol in to left knee by Dr. Romeo AppleHarrison  PICC line placement   Consultations:  Orthopedics  Discharge Exam: Filed Vitals:   02/18/14 0513  BP: 121/82  Pulse: 66  Temp: 97.3 F (36.3 C)  Resp: 20    General: NAD Cardiovascular: S1, S2 RRR Respiratory: CTA B Skin: erythema around elbow improving.  Left knee joint effusion resolved  Discharge Instructions You were cared for by a hospitalist during your hospital stay. If you have any questions about your discharge medications or the care you received while you were in the hospital after you are discharged, you can call the unit and asked to speak with the hospitalist on call if the hospitalist that took care of you is not available. Once you  are discharged, your primary care physician will handle any further medical issues. Please note that NO REFILLS for any discharge medications will be authorized once you are discharged, as it is imperative that you return to your primary care physician (or establish a relationship with a primary care physician if you do not have one) for your aftercare needs so that they can reassess your need for medications  and monitor your lab values.  Discharge Instructions   Call MD for:  redness, tenderness, or signs of infection (pain, swelling, redness, odor or green/yellow discharge around incision site)    Complete by:  As directed      Call MD for:  severe uncontrolled pain    Complete by:  As directed      Call MD for:  temperature >100.4    Complete by:  As directed      Diet - low sodium heart healthy    Complete by:  As directed      Face-to-face encounter (required for Medicare/Medicaid patients)    Complete by:  As directed   I Stone Spirito certify that this patient is under my care and that I, or a nurse practitioner or physician's assistant working with me, had a face-to-face encounter that meets the physician face-to-face encounter requirements with this patient on 02/18/2014. The encounter with the patient was in whole, or in part for the following medical condition(s) which is the primary reason for home health care (List medical condition): Patient admitted with cellulitis and will be discharged home with IV antibiotics via PICC line.  He will need home health nursing for antibiotic management and picc line care.  The encounter with the patient was in whole, or in part, for the following medical condition, which is the primary reason for home health care:  cellulitis  I certify that, based on my findings, the following services are medically necessary home health services:  Nursing  My clinical findings support the need for the above services:  OTHER SEE COMMENTS  Further, I certify that my clinical findings support that this patient is homebound due to:  Unable to leave home safely without assistance  Reason for Medically Necessary Home Health Services:  Skilled Nursing- Assessment and Training for Infusion Therapy, Line Care, and Infection Control     Home Health    Complete by:  As directed   Vancomycin 1000mg  IV q12h for 4 days PICC line care, labs and med adjustments per [facility] Discontinue picc line once antibiotics are complete  To provide the following care/treatments:  RN     Increase activity slowly    Complete by:  As directed             Medication List    STOP taking these medications       sulfamethoxazole-trimethoprim 800-160 MG per tablet  Commonly known as:  BACTRIM DS      TAKE these medications       allopurinol 300 MG tablet  Commonly known as:  ZYLOPRIM  Take 300 mg by mouth daily.     losartan 50 MG tablet  Commonly known as:  COZAAR  Take 50 mg by mouth daily.     metFORMIN 500 MG tablet  Commonly known as:  GLUCOPHAGE  Take 500 mg by mouth daily.     vancomycin 1 GM/200ML Soln  Commonly known as:  VANCOCIN  Inject 200 mLs (1,000 mg total) into the vein every 12 (twelve) hours. For 4 more days  Allergies  Allergen Reactions  . Vancomycin Hives    Hives resolved with pepcid and benadryl       Follow-up Information   Follow up with Wilson SingerGOSRANI,NIMISH C, MD.   Specialty:  Internal Medicine   Contact information:   [address] [place] [id number] [phone]       Follow up On 02/24/2014. (at 12:00)        The results of significant diagnostics from this hospitalization (including imaging, microbiology, ancillary and laboratory) are listed below for reference.    Significant Diagnostic Studies: Dg Elbow Complete Right  02/13/2014   CLINICAL DATA:  Elbow pain, erythema and swelling.  No acute injury.  EXAM: RIGHT ELBOW - COMPLETE 3+ VIEW  COMPARISON:  None.  FINDINGS: The mineralization and alignment are normal. There is no evidence of acute fracture or dislocation. The joint spaces are maintained. There is spurring of the olecranon and coronoid processes. There is no elbow joint effusion. Subcutaneous edema is noted surrounding the elbow, especially posteriorly. There appears to be some swelling over the olecranon process, suggesting possible olecranon bursitis.  IMPRESSION: 1. No acute osseous  findings. 2. Soft tissue swelling around the elbow, especially posteriorly. Consider olecranon bursitis.   Electronically Signed   By: Roxy HorsemanBill  Veazey M.D.   On: 02/13/2014 16:25    Microbiology: No results found for this or any previous visit (from the past 240 hour(s)).   Labs: Basic Metabolic Panel:  Recent Labs Lab 02/13/14 1225 02/14/14 0546 02/15/14 0539 02/16/14 0517 02/18/14 0604  NA 138 137 129* 138 140  K 3.8 4.2 4.5 4.2 4.7  CL 99 100 90* 97 102  CO2 25 23 23 27 27   GLUCOSE 170* 161* 189* 162* 223*  BUN 10 11 9 13 19   CREATININE 0.83 0.89 0.74 0.78 0.86  CALCIUM 9.1 8.9 9.2 9.4 8.9   Liver Function Tests:  Recent Labs Lab 02/14/14 0546  AST 26  ALT 27  ALKPHOS 88  BILITOT 0.5  PROT 6.6  ALBUMIN 2.9*   No results found for this basename: LIPASE, AMYLASE,  in the last 168 hours No results found for this basename: AMMONIA,  in the last 168 hours CBC:  Recent Labs Lab 02/13/14 1225 02/14/14 0546 02/15/14 0539  WBC 9.1 7.8 9.0  NEUTROABS 6.5  --   --   HGB 12.8* 12.5* 13.1  HCT 37.5* 36.8* 38.7*  MCV 96.2 97.9 97.2  PLT 152 153 174   Cardiac Enzymes: No results found for this basename: CKTOTAL, CKMB, CKMBINDEX, TROPONINI,  in the last 168 hours BNP: BNP (last 3 results) No results found for this basename: PROBNP,  in the last 8760 hours CBG:  Recent Labs Lab 02/17/14 0713 02/17/14 1109 02/17/14 1618 02/17/14 2041 02/18/14 0710  GLUCAP 228* 296* 292* 245* 184*       Signed:  Katty Fretwell  [facility] 02/18/2014, 9:31 AM

## 2014-02-19 NOTE — Progress Notes (Signed)
UR chart review completed.  

## 2015-05-26 IMAGING — CR DG ELBOW COMPLETE 3+V*R*
4 series · 4 of 4 positions shown · non-contrast
Comparison: None.

CLINICAL DATA: Elbow pain, erythema and swelling.  No acute injury.

EXAM:
RIGHT ELBOW - COMPLETE 3+ VIEW

[view not recorded (1 of 4)]
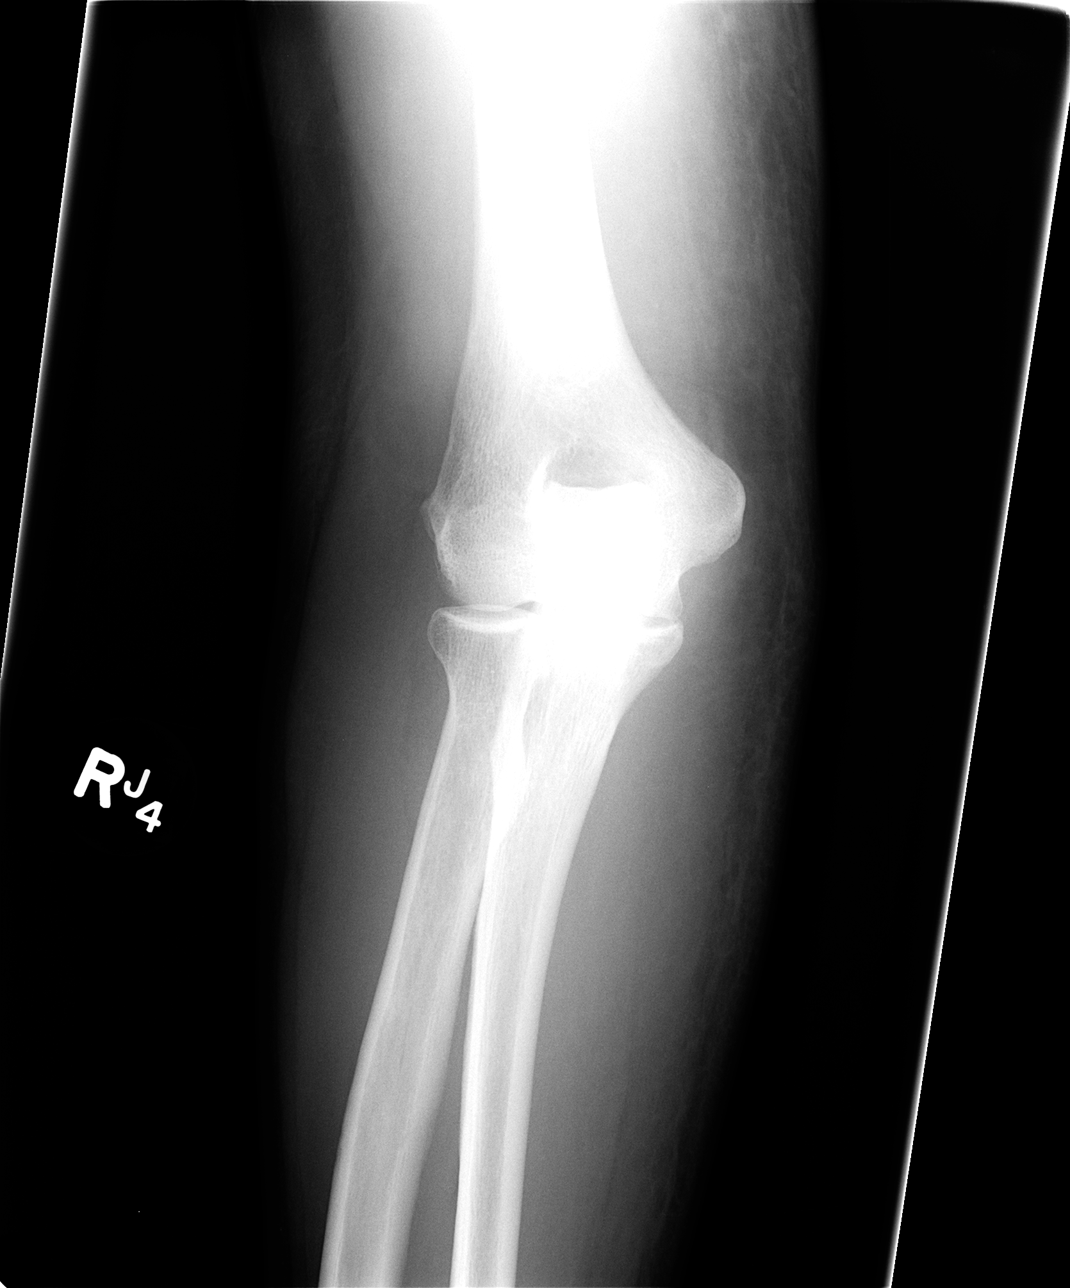

[view not recorded (2 of 4)]
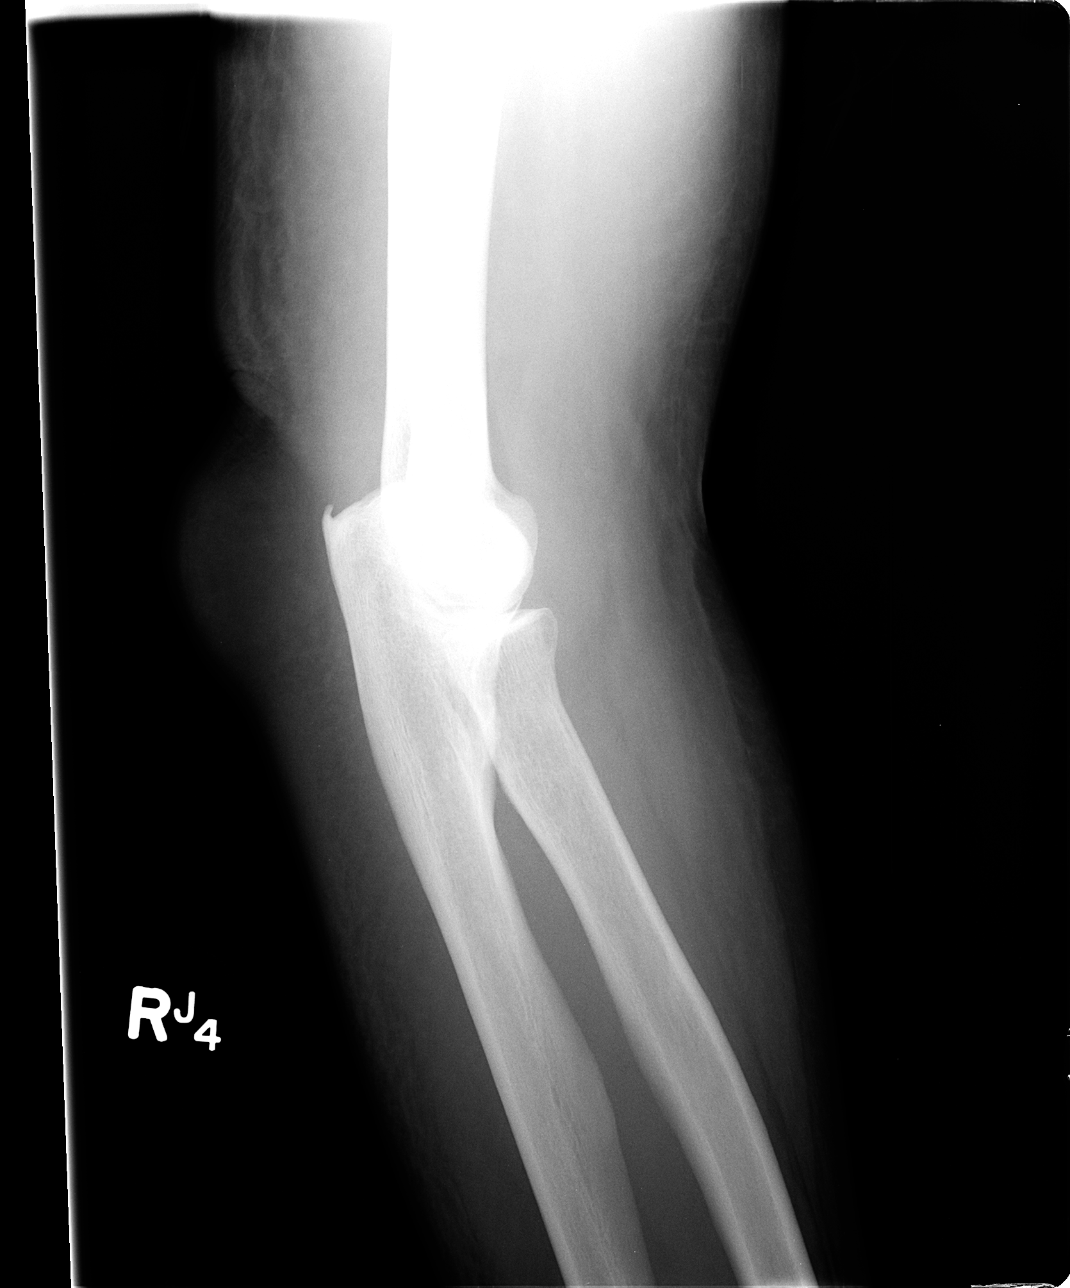

[view not recorded (3 of 4)]
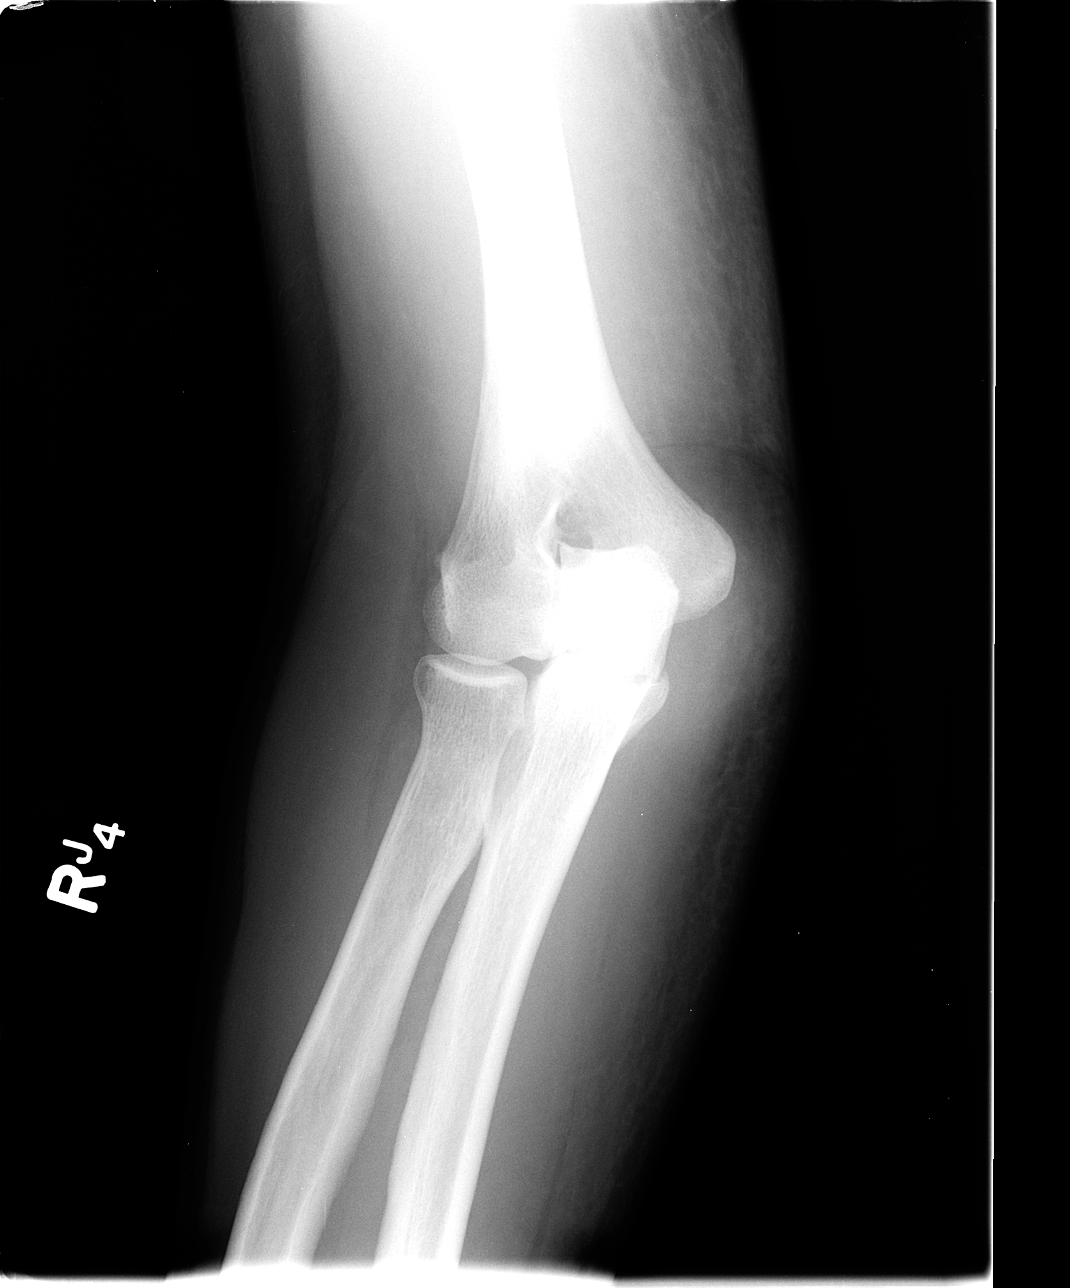

[view not recorded (4 of 4)]
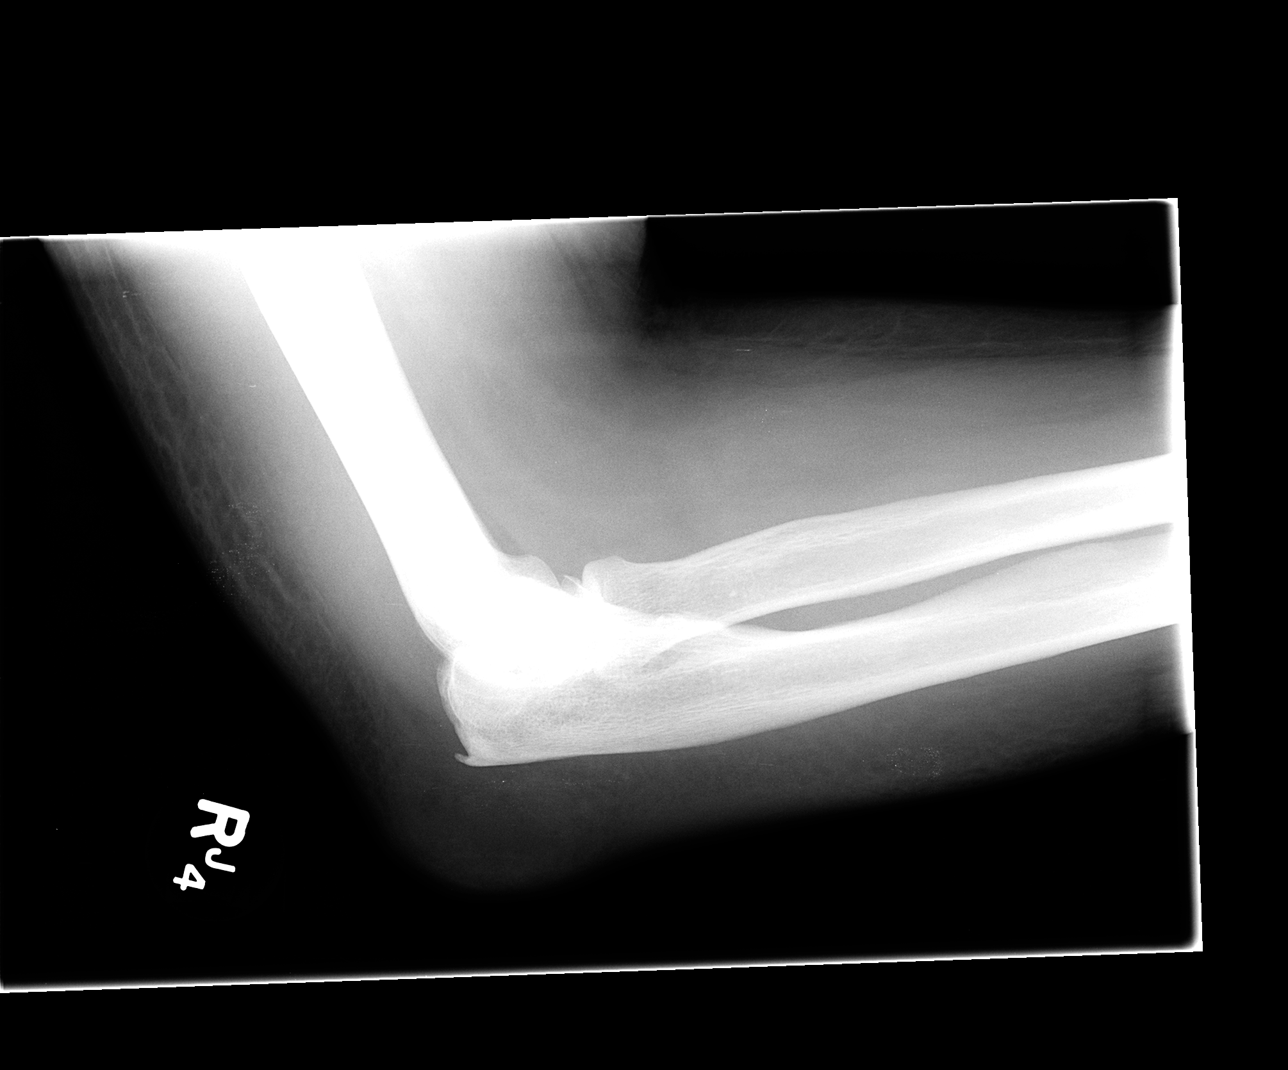

[4 of 4 positions shown; findings below may reference images not displayed]

FINDINGS: The mineralization and alignment are normal. There is no evidence of
acute fracture or dislocation. The joint spaces are maintained.
There is spurring of the olecranon and coronoid processes. There is
no elbow joint effusion. Subcutaneous edema is noted surrounding the
elbow, especially posteriorly. There appears to be some swelling
over the olecranon process, suggesting possible olecranon bursitis.
IMPRESSION: 1. No acute osseous findings.
2. Soft tissue swelling around the elbow, especially posteriorly.
Consider olecranon bursitis.

## 2016-03-22 ENCOUNTER — Encounter (HOSPITAL_COMMUNITY): Payer: Self-pay | Admitting: Emergency Medicine

## 2016-03-22 ENCOUNTER — Emergency Department (HOSPITAL_COMMUNITY): Payer: Worker's Compensation

## 2016-03-22 ENCOUNTER — Emergency Department (HOSPITAL_COMMUNITY)
Admission: EM | Admit: 2016-03-22 | Discharge: 2016-03-22 | Disposition: A | Discharge status: Home / Self Care | Payer: Worker's Compensation | Attending: Emergency Medicine | Admitting: Emergency Medicine

## 2016-03-22 DIAGNOSIS — E119 Type 2 diabetes mellitus without complications: Secondary | ICD-10-CM | POA: Insufficient documentation

## 2016-03-22 DIAGNOSIS — F17220 Nicotine dependence, chewing tobacco, uncomplicated: Secondary | ICD-10-CM | POA: Insufficient documentation

## 2016-03-22 DIAGNOSIS — S8391XA Sprain of unspecified site of right knee, initial encounter: Secondary | ICD-10-CM | POA: Diagnosis not present

## 2016-03-22 DIAGNOSIS — Z7984 Long term (current) use of oral hypoglycemic drugs: Secondary | ICD-10-CM | POA: Insufficient documentation

## 2016-03-22 DIAGNOSIS — I10 Essential (primary) hypertension: Secondary | ICD-10-CM | POA: Insufficient documentation

## 2016-03-22 DIAGNOSIS — Y92148 Other place in prison as the place of occurrence of the external cause: Secondary | ICD-10-CM | POA: Insufficient documentation

## 2016-03-22 DIAGNOSIS — M25561 Pain in right knee: Secondary | ICD-10-CM | POA: Diagnosis present

## 2016-03-22 DIAGNOSIS — Y990 Civilian activity done for income or pay: Secondary | ICD-10-CM | POA: Diagnosis not present

## 2016-03-22 DIAGNOSIS — Y939 Activity, unspecified: Secondary | ICD-10-CM | POA: Diagnosis not present

## 2016-03-22 DIAGNOSIS — W500XXA Accidental hit or strike by another person, initial encounter: Secondary | ICD-10-CM | POA: Insufficient documentation

## 2016-03-22 HISTORY — DX: Pure hypercholesterolemia, unspecified: E78.00
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
## 2016-03-22 NOTE — Discharge Instructions (Signed)
Ibuprofen 600 mg 3 times daily for the next 5 days.  Rest.  Follow-up with your primary Dr. if not improving in the next week.

## 2016-08-22 IMAGING — US US EXTREM LOW VENOUS*R*
1 series · 13 of 24 positions shown · non-contrast
Comparison: Right knee radiographs - earlier same day

CLINICAL DATA: Right calf pain for the past week. History of
hypertension, hyperlipidemia and diabetes. Evaluate for DVT.



[Series 1: us extrem low venous*right* · 0.08mm/px · 13 of 34 slices shown]
[im 1/34]
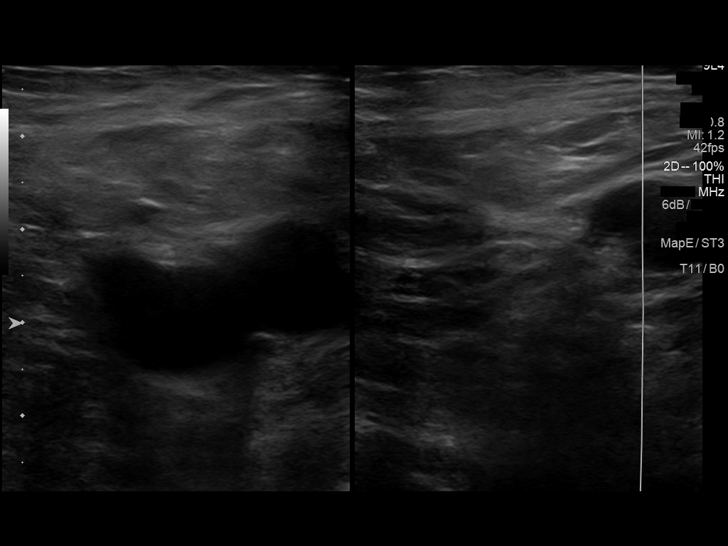
[im 3/34]
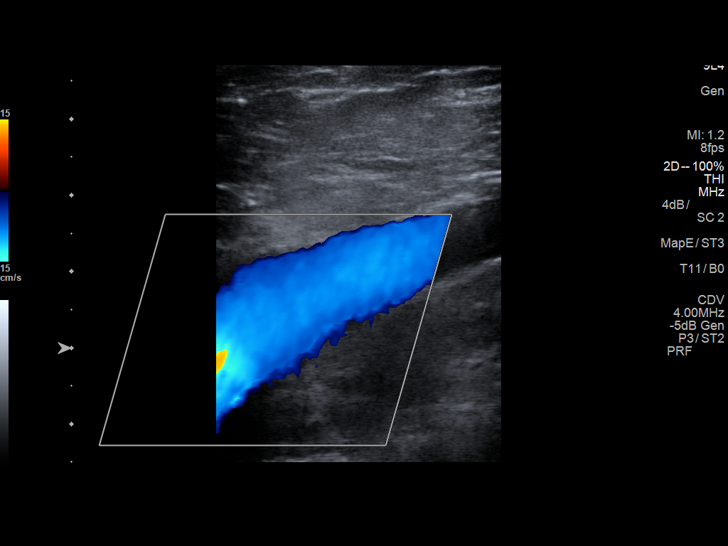
[im 6/34]
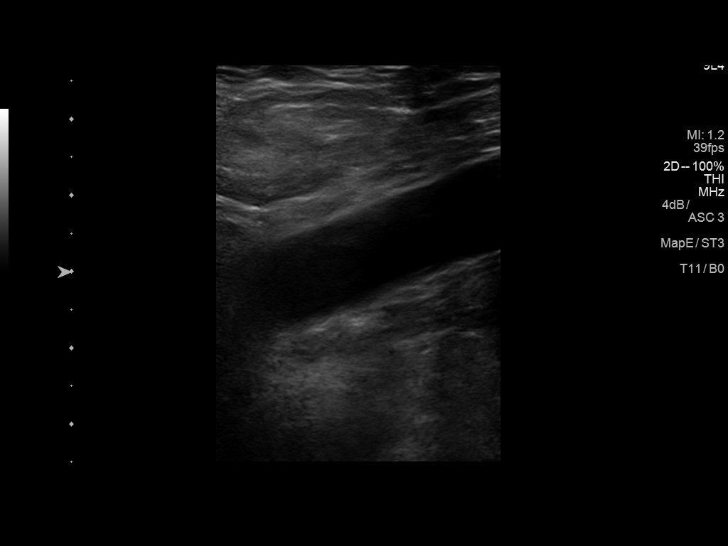
[im 9/34]
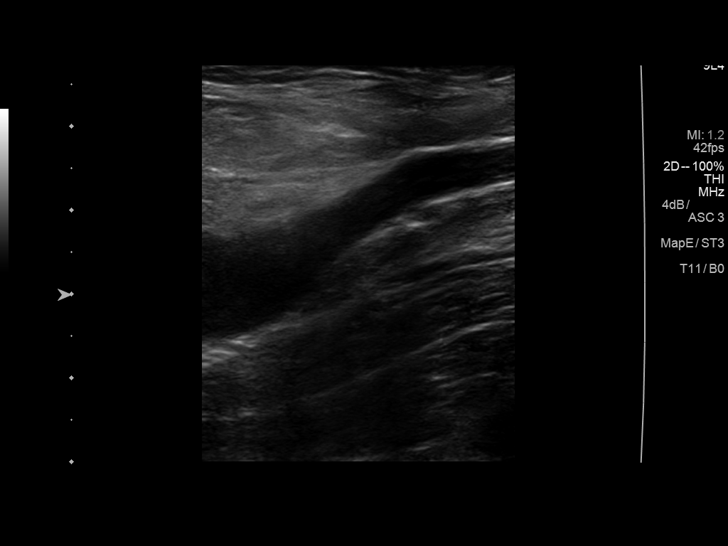
[im 12/34]
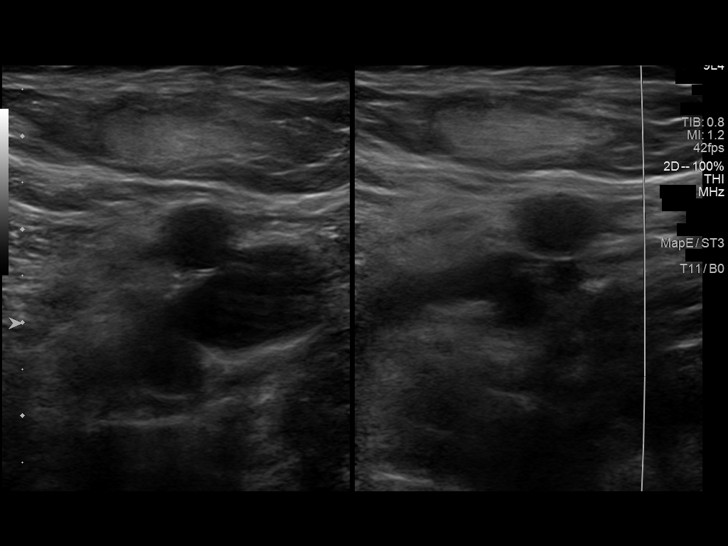
[im 15/34]
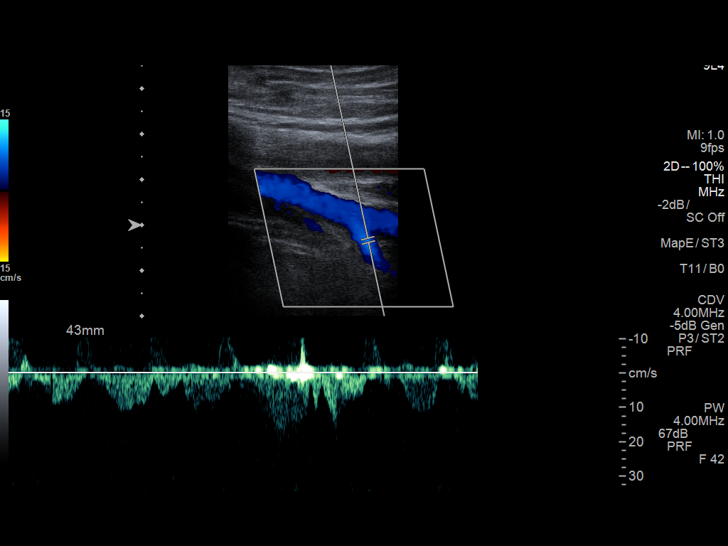
[im 18/34]
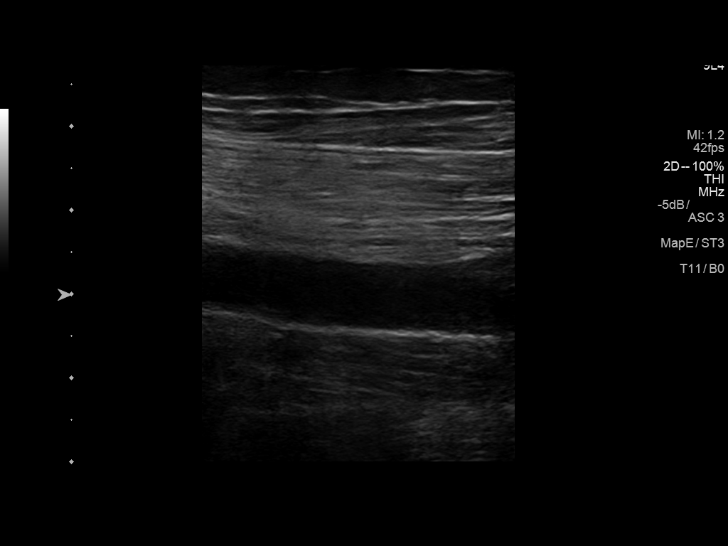
[im 19/34]
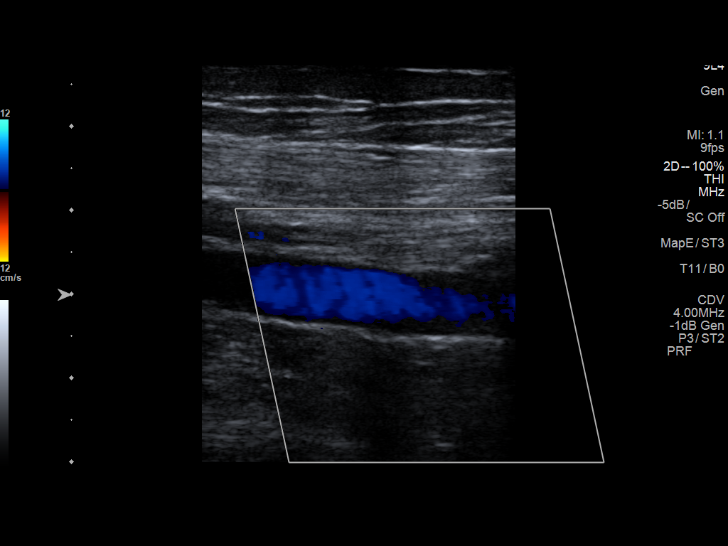
[im 22/34]
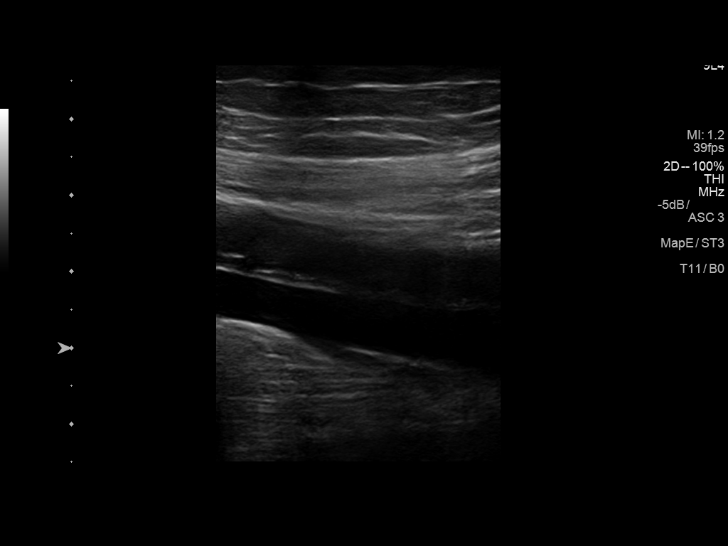
[im 25/34]
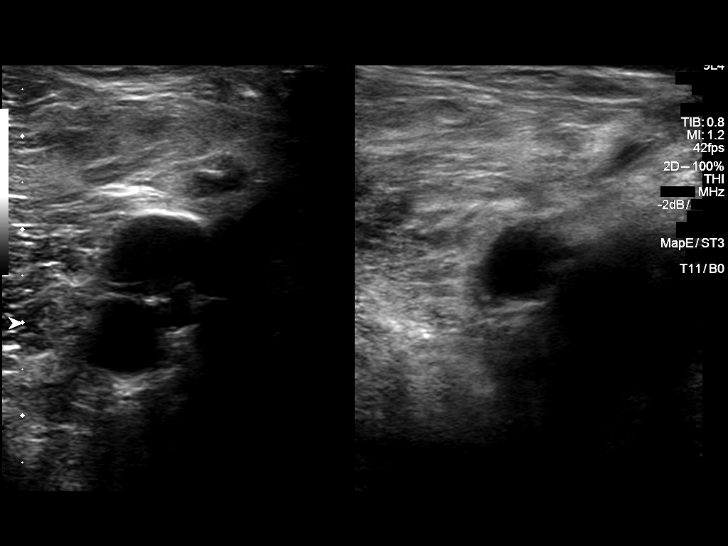
[im 28/34]
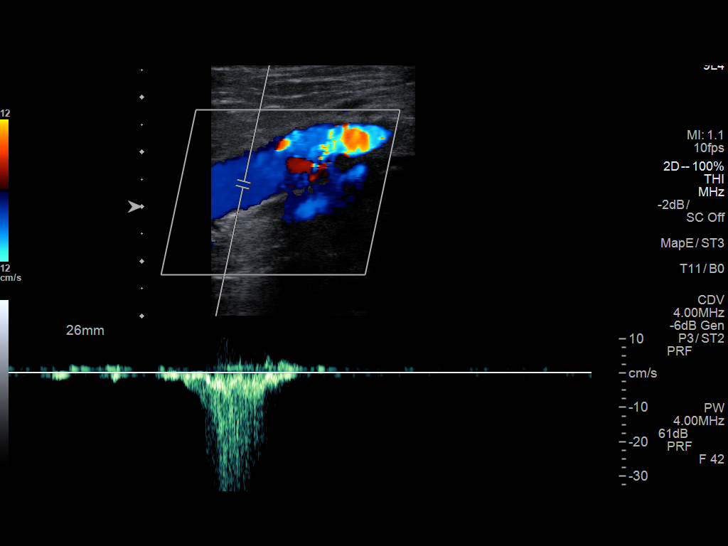
[im 31/34]
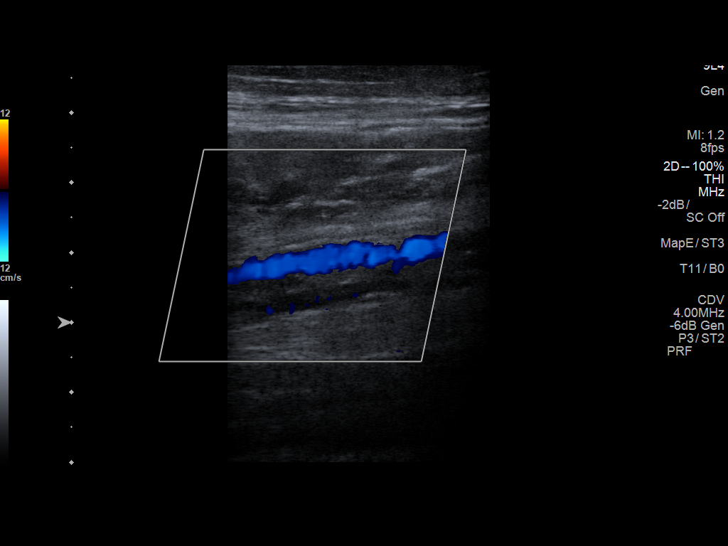
[im 34/34]
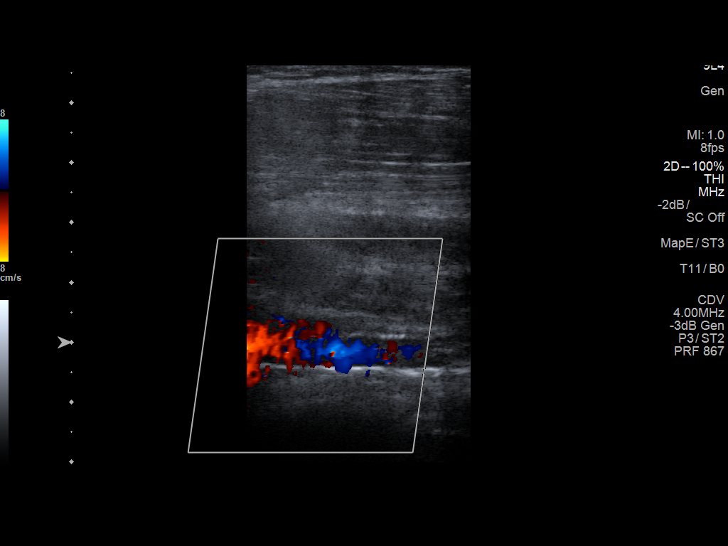

[13 of 24 positions shown; findings below may reference images not displayed]

FINDINGS: Contralateral Common Femoral Vein: Respiratory phasicity is normal
and symmetric with the symptomatic side. No evidence of thrombus.
Normal compressibility.

Common Femoral Vein: No evidence of thrombus. Normal
compressibility, respiratory phasicity and response to augmentation.

Saphenofemoral Junction: No evidence of thrombus. Normal
compressibility and flow on color Doppler imaging.

Profunda Femoral Vein: No evidence of thrombus. Normal
compressibility and flow on color Doppler imaging.

Femoral Vein: No evidence of thrombus. Normal compressibility,
respiratory phasicity and response to augmentation.

Popliteal Vein: No evidence of thrombus. Normal compressibility,
respiratory phasicity and response to augmentation.

Calf Veins: No evidence of thrombus. Normal compressibility and flow
on color Doppler imaging.

Superficial Great Saphenous Vein: No evidence of thrombus. Normal
compressibility and flow on color Doppler imaging.

Venous Reflux:  None.

Other Findings:  None.
IMPRESSION: No evidence of DVT within right lower extremity.

## 2017-01-23 ENCOUNTER — Emergency Department (HOSPITAL_COMMUNITY)
Admission: EM | Admit: 2017-01-23 | Discharge: 2017-01-23 | Disposition: A | Discharge status: Home / Self Care | Payer: Worker's Compensation | Attending: Emergency Medicine | Admitting: Emergency Medicine

## 2017-01-23 ENCOUNTER — Emergency Department (HOSPITAL_COMMUNITY): Payer: Worker's Compensation

## 2017-01-23 ENCOUNTER — Encounter (HOSPITAL_COMMUNITY): Payer: Self-pay | Admitting: Emergency Medicine

## 2017-01-23 DIAGNOSIS — Y990 Civilian activity done for income or pay: Secondary | ICD-10-CM | POA: Diagnosis not present

## 2017-01-23 DIAGNOSIS — I10 Essential (primary) hypertension: Secondary | ICD-10-CM | POA: Insufficient documentation

## 2017-01-23 DIAGNOSIS — F17220 Nicotine dependence, chewing tobacco, uncomplicated: Secondary | ICD-10-CM | POA: Diagnosis not present

## 2017-01-23 DIAGNOSIS — S8991XA Unspecified injury of right lower leg, initial encounter: Secondary | ICD-10-CM | POA: Diagnosis present

## 2017-01-23 DIAGNOSIS — Y939 Activity, unspecified: Secondary | ICD-10-CM | POA: Diagnosis not present

## 2017-01-23 DIAGNOSIS — Y929 Unspecified place or not applicable: Secondary | ICD-10-CM | POA: Diagnosis not present

## 2017-01-23 DIAGNOSIS — E119 Type 2 diabetes mellitus without complications: Secondary | ICD-10-CM | POA: Insufficient documentation

## 2017-01-23 DIAGNOSIS — S8391XA Sprain of unspecified site of right knee, initial encounter: Secondary | ICD-10-CM | POA: Diagnosis not present

## 2017-01-23 DIAGNOSIS — W19XXXA Unspecified fall, initial encounter: Secondary | ICD-10-CM | POA: Diagnosis not present

## 2017-01-23 DIAGNOSIS — Z7984 Long term (current) use of oral hypoglycemic drugs: Secondary | ICD-10-CM | POA: Diagnosis not present

## 2017-01-23 MED ORDER — DICLOFENAC SODIUM 50 MG PO TBEC: 50.0000 mg | DELAYED_RELEASE_TABLET | Freq: Two times a day (BID) | ORAL | 0 refills | Status: DC

## 2017-01-23 NOTE — ED Provider Notes (Signed)
[facility] Provider Note   CSN: [id number] Arrival date & time: 01/23/17  0949     History   Chief Complaint Chief Complaint  Patient presents with  . Knee Pain    HPI Allen Kaiser is a 51 y.o. male.  The history is provided by the patient. No language interpreter was used.  Knee Pain   This is a new problem. The current episode started 3 to 5 hours ago. The problem occurs constantly. The pain is present in the right knee. The quality of the pain is described as aching. The pain is at a severity of 5/10. The pain is moderate. He has tried nothing for the symptoms. The treatment provided no relief. There has been no history of extremity trauma.  Pt reports he twisted knee while working.  Pt reports he heard a pop.  Pt complains of difficulty walking  Past Medical History:  Diagnosis Date  . Diabetes mellitus   . High cholesterol   . Hypertension     Patient Active Problem List   Diagnosis Date Noted  . Acute gout 02/18/2014  . Diabetes mellitus (HCC) 02/13/2014  . Cellulitis of right arm 02/13/2014  . Hypertension 02/13/2014  . Cellulitis of arm 02/13/2014    Past Surgical History:  Procedure Laterality Date  . CARDIAC CATHETERIZATION    . FRACTURE SURGERY    . HERNIA REPAIR         Home Medications    Prior to Admission medications   Medication Sig Start Date End Date Taking? Authorizing Provider  allopurinol (ZYLOPRIM) 300 MG tablet Take 300 mg by mouth daily.    [provider]  diclofenac (VOLTAREN) 50 MG EC tablet Take 1 tablet (50 mg total) by mouth 2 (two) times daily. 01/23/17   Elson Areas, PA-C  losartan (COZAAR) 50 MG tablet Take 50 mg by mouth daily.    [provider]  metFORMIN (GLUCOPHAGE) 500 MG tablet Take 500 mg by mouth daily.     [provider]  vancomycin (VANCOCIN) 1 GM/200ML SOLN Inject 200 mLs (1,000 mg total) into the vein every 12 (twelve) hours. For 4 more days 02/18/14   Erick Blinks, MD     Family History History reviewed. No pertinent family history.  Social History Social History  Substance Use Topics  . Smoking status: Never Smoker  . Smokeless tobacco: Current User    Types: Chew  . Alcohol use 1.2 oz/week    2 Cans of beer per week     Comment: "2 beers a day"     Allergies   Vancomycin   Review of Systems Review of Systems  All other systems reviewed and are negative.    Physical Exam Updated Vital Signs BP 121/78 (BP Location: Right Arm)   Pulse 78   Temp 97.8 F (36.6 C) (Oral)   Resp 17   Ht 6\' 2"  (1.88 m)   Wt 95.3 kg (210 lb)   SpO2 98%   BMI 26.96 kg/m   Physical Exam  Constitutional: He appears well-developed and well-nourished.  HENT:  Head: Normocephalic.  Right Ear: External ear normal.  Left Ear: External ear normal.  Musculoskeletal: He exhibits tenderness.  Tender right knee,  Pain with range of motion,  No instability, nv snd ns intact  Neurological: He is alert.  Skin: Skin is warm.  Psychiatric: He has a normal mood and affect.  Nursing note and vitals reviewed.    ED Treatments / Results  Labs (  all labs ordered are listed, but only abnormal results are displayed) Labs Reviewed - No data to display  EKG  EKG Interpretation None       Radiology Dg Knee Complete 4 Views Right  Result Date: 01/23/2017 CLINICAL DATA:  Twisting injury of the right knee similar symptoms in the past. EXAM: RIGHT KNEE - COMPLETE 4+ VIEW COMPARISON:  Right knee radiograph series dated March 22, 2016 FINDINGS: The bones are subjectively adequately mineralized. The joint spaces are well maintained. There is a tiny spur arising from the inferior articular margin of the patella which is stable. There is no acute fracture, dislocation, or joint effusion. IMPRESSION: There is no acute bony abnormality of the right knee. Minimal degenerative spurring of the inferior margin of the patella is noted. If the patient's symptoms warrant further  evaluation, MRI would be the most useful next imaging step. Electronically Signed   By: David  SwazilandJordan M.D.   On: 01/23/2017 10:39    Procedures Procedures (including critical care time)  Medications Ordered in ED Medications - No data to display   Initial Impression / Assessment and Plan / ED Course  I have reviewed the triage vital signs and the nursing notes.  Pertinent labs & imaging results that were available during my care of the patient were reviewed by me and considered in my medical decision making (see chart for details).     Knee imbolizer Crutches Follow up with Dr. Romeo AppleHarrison for recheck  Final Clinical Impressions(s) / ED Diagnoses   Final diagnoses:  Sprain of right knee, unspecified ligament, initial encounter    New Prescriptions New Prescriptions   DICLOFENAC (VOLTAREN) 50 MG EC TABLET    Take 1 tablet (50 mg total) by mouth 2 (two) times daily.  An After Visit Summary was printed and given to the patient.    Osie CheeksSofia, Zedrick Springsteen K, PA-C 01/23/17 1116    Benjiman CorePickering, Nathan, MD 01/23/17 [phone]

## 2017-01-23 NOTE — ED Triage Notes (Signed)
Pt at work , got up and turned and stated right knee popped and pt almost fell. Pt was limping in triage. Pt has swelling to right medial knee.

## 2017-05-20 ENCOUNTER — Encounter (HOSPITAL_COMMUNITY): Payer: Self-pay | Admitting: Emergency Medicine

## 2017-05-20 ENCOUNTER — Emergency Department (HOSPITAL_COMMUNITY)
Admission: EM | Admit: 2017-05-20 | Discharge: 2017-05-20 | Disposition: A | Discharge status: Home / Self Care | Payer: Worker's Compensation | Attending: Emergency Medicine | Admitting: Emergency Medicine

## 2017-05-20 ENCOUNTER — Emergency Department (HOSPITAL_COMMUNITY): Payer: Worker's Compensation

## 2017-05-20 DIAGNOSIS — R200 Anesthesia of skin: Secondary | ICD-10-CM | POA: Diagnosis not present

## 2017-05-20 DIAGNOSIS — M792 Neuralgia and neuritis, unspecified: Secondary | ICD-10-CM

## 2017-05-20 DIAGNOSIS — S4992XA Unspecified injury of left shoulder and upper arm, initial encounter: Secondary | ICD-10-CM | POA: Diagnosis present

## 2017-05-20 DIAGNOSIS — F17220 Nicotine dependence, chewing tobacco, uncomplicated: Secondary | ICD-10-CM | POA: Insufficient documentation

## 2017-05-20 DIAGNOSIS — Z7984 Long term (current) use of oral hypoglycemic drugs: Secondary | ICD-10-CM | POA: Insufficient documentation

## 2017-05-20 DIAGNOSIS — Y92248 Other public administrative building as the place of occurrence of the external cause: Secondary | ICD-10-CM | POA: Diagnosis not present

## 2017-05-20 DIAGNOSIS — Y990 Civilian activity done for income or pay: Secondary | ICD-10-CM | POA: Diagnosis not present

## 2017-05-20 DIAGNOSIS — E119 Type 2 diabetes mellitus without complications: Secondary | ICD-10-CM | POA: Diagnosis not present

## 2017-05-20 DIAGNOSIS — Y9389 Activity, other specified: Secondary | ICD-10-CM | POA: Insufficient documentation

## 2017-05-20 DIAGNOSIS — Y33XXXA Other specified events, undetermined intent, initial encounter: Secondary | ICD-10-CM | POA: Diagnosis not present

## 2017-05-20 DIAGNOSIS — Z79899 Other long term (current) drug therapy: Secondary | ICD-10-CM | POA: Insufficient documentation

## 2017-05-20 DIAGNOSIS — I10 Essential (primary) hypertension: Secondary | ICD-10-CM | POA: Diagnosis not present

## 2017-05-20 DIAGNOSIS — S40012A Contusion of left shoulder, initial encounter: Secondary | ICD-10-CM | POA: Diagnosis not present

## 2017-05-20 MED ORDER — DICLOFENAC SODIUM 75 MG PO TBEC: 75.0000 mg | DELAYED_RELEASE_TABLET | Freq: Two times a day (BID) | ORAL | 0 refills | Status: AC

## 2017-05-20 NOTE — ED Triage Notes (Signed)
Pt reports went to open a door at work. Pt reports door hit left shoulder. Pt reports stepped back and reports sharp shooting pain in left shoulder with intermittent numbness. No obvious deformity noted. Radial pulse strong. Cap refill wnl.

## 2017-05-20 NOTE — Discharge Instructions (Signed)
Your xray is negative.  I suspect you have a deep contusion of your shoulder which has irritated the nerve causing the tingling in your finger tips.  This should resolve with anti inflammatory medicines, ice and resting the shoulder.  Get rechecked by your primary doctor if your symptoms are not improving over the next week.

## 2017-05-20 NOTE — ED Provider Notes (Signed)
[facility] Provider Note   CSN: [id number] Arrival date & time: 05/20/17  0831     History   Chief Complaint Chief Complaint  Patient presents with  . Shoulder Pain    HPI Allen Kaiser is a 51 y.o. 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  He reports immediate pain and tingling into his left 3rd, 4th and 5th fingers with persistent numbness in the finger tips.  He denies neck pain at this time, but does problems with his neck as well.  He has had no medicines prior to arrival.  Followed by the [facility], just released for work after several days out due to right shoulder arthritis.    HPI  Past Medical History:  Diagnosis Date  . Diabetes mellitus   . High cholesterol   . Hypertension     Patient Active Problem List   Diagnosis Date Noted  . Acute gout 02/18/2014  . Diabetes mellitus (HCC) 02/13/2014  . Cellulitis of right arm 02/13/2014  . Hypertension 02/13/2014  . Cellulitis of arm 02/13/2014    Past Surgical History:  Procedure Laterality Date  . CARDIAC CATHETERIZATION    . FRACTURE SURGERY    . HERNIA REPAIR         Home Medications    Prior to Admission medications   Medication Sig Start Date End Date Taking? Authorizing Provider  allopurinol (ZYLOPRIM) 300 MG tablet Take 300 mg by mouth daily.    [provider]  diclofenac (VOLTAREN) 75 MG EC tablet Take 1 tablet (75 mg total) by mouth 2 (two) times daily. 05/20/17   Burgess Amor, PA-C  losartan (COZAAR) 50 MG tablet Take 50 mg by mouth daily.    [provider]  metFORMIN (GLUCOPHAGE) 500 MG tablet Take 500 mg by mouth daily.     [provider]  vancomycin (VANCOCIN) 1 GM/200ML SOLN Inject 200 mLs (1,000 mg total) into the vein every 12 (twelve) hours. For 4 more days  02/18/14   Erick Blinks, MD    Family History History reviewed. No pertinent family history.  Social History Social History  Substance Use Topics  . Smoking status: Never Smoker  . Smokeless tobacco: Current User    Types: Chew  . Alcohol use 1.2 oz/week    2 Cans of beer per week     Comment: "2 beers a day"     Allergies   Patient has no active allergies.   Review of Systems Review of Systems  Constitutional: Negative for fever.  Musculoskeletal: Positive for arthralgias. Negative for joint swelling and myalgias.  Neurological: Positive for numbness. Negative for weakness.     Physical Exam Updated Vital Signs BP (!) 136/97 (BP Location: Right Arm)   Pulse 76   Temp 98.3 F (36.8 C) (Oral)   Resp 18   Ht  (1.88 m)   Wt 98.9 kg (218 lb)   SpO2 96%   BMI 27.99 kg/m   Physical Exam  Constitutional: He is oriented to person, place, and time. He appears well-developed and well-nourished.  HENT:  Head: Atraumatic.  Neck: Normal range of motion.  Cardiovascular:  Radial pulses equal bilaterally. Less than 2 second cap refill in fingertips.  Musculoskeletal: He exhibits tenderness.       Left  shoulder: He exhibits bony tenderness. He exhibits normal range of motion, no swelling, no effusion, no crepitus, no deformity, no spasm and normal strength.  ttp left lateral humeral head without palpable deformity.  No elbow forearm, wrist or hand pain.  Neurological: He is alert and oriented to person, place, and time. He has normal strength. He displays normal reflexes. No sensory deficit.  Equal grip strength.  No strength deficits noted with wrist and elbow flexion/extension.  He has decreased sensation to fine touch in his left fifth, fourth and lateral half of his third finger.  Skin: Skin is warm and dry.  Psychiatric: He has a normal mood and affect.     ED Treatments / Results  Labs (all labs ordered are listed, but only abnormal results are  displayed) Labs Reviewed - No data to display  EKG  EKG Interpretation None       Radiology Dg Shoulder Left  Result Date: 05/20/2017 CLINICAL DATA:  Pt reports went to open a door at work today. Pt reports door hit left shoulder. Pt reports stepped back and reports sharp shooting pain in left shoulder with intermittent numbness EXAM: LEFT SHOULDER - 2+ VIEW COMPARISON:  None. FINDINGS: No fracture, bone lesion or dislocation. Mild AC joint osteoarthritis.  Glenohumeral joint is well preserved. Soft tissues are unremarkable. IMPRESSION: 1. No fracture, dislocation or acute finding. 2. Mild AC joint osteoarthritis. Electronically Signed   By: Amie Portland M.D.   On: 05/20/2017 09:44    Procedures Procedures (including critical care time)  Medications Ordered in ED Medications - No data to display   Initial Impression / Assessment and Plan / ED Course  I have reviewed the triage vital signs and the nursing notes.  Pertinent labs & imaging results that were available during my care of the patient were reviewed by me and considered in my medical decision making (see chart for details).     Imaging reviewed and discussed with pt.  Suspect deep tissue edema/contusion.  No weakness in left arm appreciated, fine touch decrease in ulnar distribution. Suspect this will resolve with anti inflammatories, ice, rest.  Prn f/u with pcp if sx persist over the next week.  Final Clinical Impressions(s) / ED Diagnoses   Final diagnoses:  Contusion of left shoulder, initial encounter  Radicular pain in left arm    New Prescriptions New Prescriptions   DICLOFENAC (VOLTAREN) 75 MG EC TABLET    Take 1 tablet (75 mg total) by mouth 2 (two) times daily.     Burgess Amor, PA-C 05/20/17 1610    Lavera Guise, MD 05/20/17 [phone]
